# Patient Record
Sex: Male | Born: 1970 | Race: Black or African American | Hispanic: No | Marital: Married | Smoking: Never smoker
Health system: Southern US, Community
[De-identification: ages and names within clinical notes are randomized; demographics above are authoritative.]

## PROBLEM LIST (undated history)

## (undated) DIAGNOSIS — I10 Essential (primary) hypertension: Secondary | ICD-10-CM

## (undated) DIAGNOSIS — U071 COVID-19: Secondary | ICD-10-CM

## (undated) DIAGNOSIS — E119 Type 2 diabetes mellitus without complications: Secondary | ICD-10-CM

---

## 2004-10-28 ENCOUNTER — Emergency Department (HOSPITAL_COMMUNITY): Admission: EM | Admit: 2004-10-28 | Discharge: 2004-10-28 | Payer: Self-pay | Admitting: Family Medicine

## 2006-11-17 ENCOUNTER — Emergency Department (HOSPITAL_COMMUNITY): Admission: EM | Admit: 2006-11-17 | Discharge: 2006-11-18 | Payer: Self-pay | Admitting: Emergency Medicine

## 2007-01-19 ENCOUNTER — Emergency Department (HOSPITAL_COMMUNITY): Admission: EM | Admit: 2007-01-19 | Discharge: 2007-01-19 | Payer: Self-pay | Admitting: Emergency Medicine

## 2007-12-18 ENCOUNTER — Emergency Department (HOSPITAL_COMMUNITY): Admission: EM | Admit: 2007-12-18 | Discharge: 2007-12-18 | Payer: Self-pay | Admitting: Emergency Medicine

## 2009-03-20 ENCOUNTER — Emergency Department (HOSPITAL_COMMUNITY): Admission: EM | Admit: 2009-03-20 | Discharge: 2009-03-20 | Payer: Self-pay | Admitting: Family Medicine

## 2010-09-22 ENCOUNTER — Emergency Department (HOSPITAL_COMMUNITY)
Admission: EM | Admit: 2010-09-22 | Discharge: 2010-09-22 | Payer: Self-pay | Source: Home / Self Care | Admitting: Emergency Medicine

## 2013-06-02 ENCOUNTER — Other Ambulatory Visit: Payer: Self-pay | Admitting: Orthopaedic Surgery

## 2013-06-02 DIAGNOSIS — M25532 Pain in left wrist: Secondary | ICD-10-CM

## 2013-06-03 ENCOUNTER — Other Ambulatory Visit: Payer: Self-pay

## 2013-06-04 ENCOUNTER — Other Ambulatory Visit: Payer: Self-pay

## 2018-02-24 ENCOUNTER — Ambulatory Visit (HOSPITAL_COMMUNITY)
Admission: EM | Admit: 2018-02-24 | Discharge: 2018-02-24 | Disposition: A | Discharge status: Home / Self Care | Payer: Non-veteran care | Attending: Internal Medicine | Admitting: Internal Medicine

## 2018-02-24 ENCOUNTER — Ambulatory Visit (INDEPENDENT_AMBULATORY_CARE_PROVIDER_SITE_OTHER): Payer: Non-veteran care

## 2018-02-24 ENCOUNTER — Encounter (HOSPITAL_COMMUNITY): Payer: Self-pay | Admitting: Emergency Medicine

## 2018-02-24 ENCOUNTER — Other Ambulatory Visit: Payer: Self-pay

## 2018-02-24 DIAGNOSIS — E119 Type 2 diabetes mellitus without complications: Secondary | ICD-10-CM | POA: Diagnosis not present

## 2018-02-24 DIAGNOSIS — R5383 Other fatigue: Secondary | ICD-10-CM

## 2018-02-24 DIAGNOSIS — R7989 Other specified abnormal findings of blood chemistry: Secondary | ICD-10-CM

## 2018-02-24 DIAGNOSIS — M791 Myalgia, unspecified site: Secondary | ICD-10-CM

## 2018-02-24 DIAGNOSIS — Z79899 Other long term (current) drug therapy: Secondary | ICD-10-CM | POA: Insufficient documentation

## 2018-02-24 DIAGNOSIS — R5381 Other malaise: Secondary | ICD-10-CM

## 2018-02-24 DIAGNOSIS — R638 Other symptoms and signs concerning food and fluid intake: Secondary | ICD-10-CM | POA: Diagnosis not present

## 2018-02-24 DIAGNOSIS — Z7984 Long term (current) use of oral hypoglycemic drugs: Secondary | ICD-10-CM | POA: Insufficient documentation

## 2018-02-24 DIAGNOSIS — E1165 Type 2 diabetes mellitus with hyperglycemia: Secondary | ICD-10-CM

## 2018-02-24 DIAGNOSIS — R42 Dizziness and giddiness: Secondary | ICD-10-CM | POA: Diagnosis not present

## 2018-02-24 DIAGNOSIS — I10 Essential (primary) hypertension: Secondary | ICD-10-CM | POA: Insufficient documentation

## 2018-02-24 HISTORY — DX: Type 2 diabetes mellitus without complications: E11.9

## 2018-02-24 HISTORY — DX: Essential (primary) hypertension: I10

## 2018-02-24 LAB — CBC WITH DIFFERENTIAL/PLATELET
BAND NEUTROPHILS: 6 %
BASOS ABS: 0 10*3/uL (ref 0.0–0.1)
BLASTS: 0 %
Basophils Relative: 0 %
EOS ABS: 0.1 10*3/uL (ref 0.0–0.7)
EOS PCT: 1 %
HEMATOCRIT: 38.8 % — AB (ref 39.0–52.0)
Hemoglobin: 13.3 g/dL (ref 13.0–17.0)
LYMPHS ABS: 2.2 10*3/uL (ref 0.7–4.0)
Lymphocytes Relative: 39 %
MCH: 28.6 pg (ref 26.0–34.0)
MCHC: 34.3 g/dL (ref 30.0–36.0)
MCV: 83.4 fL (ref 78.0–100.0)
METAMYELOCYTES PCT: 0 %
MYELOCYTES: 0 %
Monocytes Absolute: 0.7 10*3/uL (ref 0.1–1.0)
Monocytes Relative: 12 %
Neutro Abs: 2.6 10*3/uL (ref 1.7–7.7)
Neutrophils Relative %: 42 %
Other: 0 %
PLATELETS: 200 10*3/uL (ref 150–400)
Promyelocytes Relative: 0 %
RBC: 4.65 MIL/uL (ref 4.22–5.81)
RDW: 11.5 % (ref 11.5–15.5)
Smear Review: ADEQUATE
WBC: 5.6 10*3/uL (ref 4.0–10.5)
nRBC: 0 /100 WBC

## 2018-02-24 LAB — COMPREHENSIVE METABOLIC PANEL
ALBUMIN: 3.4 g/dL — AB (ref 3.5–5.0)
ALT: 37 U/L (ref 17–63)
AST: 41 U/L (ref 15–41)
Alkaline Phosphatase: 58 U/L (ref 38–126)
Anion gap: 11 (ref 5–15)
BILIRUBIN TOTAL: 1.3 mg/dL — AB (ref 0.3–1.2)
BUN: 14 mg/dL (ref 6–20)
CHLORIDE: 99 mmol/L — AB (ref 101–111)
CO2: 27 mmol/L (ref 22–32)
Calcium: 8.5 mg/dL — ABNORMAL LOW (ref 8.9–10.3)
Creatinine, Ser: 1.64 mg/dL — ABNORMAL HIGH (ref 0.61–1.24)
GFR calc Af Amer: 57 mL/min — ABNORMAL LOW (ref >60)
GFR calc non Af Amer: 49 mL/min — ABNORMAL LOW (ref >60)
GLUCOSE: 398 mg/dL — AB (ref 65–99)
POTASSIUM: 4.1 mmol/L (ref 3.5–5.1)
SODIUM: 137 mmol/L (ref 135–145)
Total Protein: 6.7 g/dL (ref 6.5–8.1)

## 2018-02-24 LAB — POCT URINALYSIS DIP (DEVICE)
Bilirubin Urine: NEGATIVE
Glucose, UA: >=1000 mg/dL — AB
Ketones, ur: NEGATIVE mg/dL
Leukocytes, UA: NEGATIVE
Nitrite: NEGATIVE
PROTEIN: 100 mg/dL — AB
SPECIFIC GRAVITY, URINE: 1.025 (ref 1.005–1.030)
UROBILINOGEN UA: 0.2 mg/dL (ref 0.0–1.0)
pH: 5.5 (ref 5.0–8.0)

## 2018-02-24 LAB — POCT I-STAT, CHEM 8
BUN: 16 mg/dL (ref 6–20)
CALCIUM ION: 1.11 mmol/L — AB (ref 1.15–1.40)
CHLORIDE: 96 mmol/L — AB (ref 101–111)
CREATININE: 1.5 mg/dL — AB (ref 0.61–1.24)
GLUCOSE: 391 mg/dL — AB (ref 65–99)
HCT: 40 % (ref 39.0–52.0)
Hemoglobin: 13.6 g/dL (ref 13.0–17.0)
POTASSIUM: 3.7 mmol/L (ref 3.5–5.1)
Sodium: 136 mmol/L (ref 135–145)
TCO2: 25 mmol/L (ref 22–32)

## 2018-02-24 MED ORDER — SODIUM CHLORIDE 0.9 % IV BOLUS
1000.0000 mL | Freq: Once | INTRAVENOUS | Status: AC
  Administered 2018-02-24: 1000 mL via INTRAVENOUS

## 2018-02-24 NOTE — ED Notes (Signed)
Fluid infusion in progress.

## 2018-02-24 NOTE — ED Triage Notes (Signed)
The patient [facility] with a complaint of generalized pain, fatigue and loss of appetite x 3 days.

## 2018-02-24 NOTE — ED Provider Notes (Signed)
[facility]    CSN: [id number] Arrival date & time: 02/24/18  1001     History   Chief Complaint Chief Complaint  Patient presents with  . Muscle Pain    HPI Jabril Pursell is a 47 y.o. male.   46 year old male with history of diabetes, hypertension comes in for 5-day history of fatigue, loss of appetite, generalized muscle aches.  He states all of this started after he finished taking a computer test, with acute onset. States afterwards he also had nausea that has since resolved.  Left lower quadrant abdominal pain that resolved.  He denies any vomiting.  States that stool was softer than normal, but not watery.  No melena, hematochezia.  He has had subjective fever, chills, night sweats.  States mild rhinorrhea, occasional cough.  Denies chest pain.  States when he stands up, the pain in the back can cause him to feel short of breath, which also caused him to feel palpitations.  Denies syncope.  Does feel occasional lightheadedness and dizziness.  He describes the dizziness as room spinning.  Denies urinary frequency, dysuria, hematuria, does not endorse darker urine.  He has had normal activity without increase in activity, denies any injury/trauma.  He states has joint pain at baseline, but not worse than normal, no swelling of the joints.  Denies possibility of tick bites.  States he has lost 15 pounds in the past 5 days, states possibly due to not eating. His normal weight is around 230lb. States last A1c was above target, but does not recall the number.  Former smoker.  Denies alcohol use, illicit drug use.  States he has been taking his glipizide daily as directed.  He has not taking his blood pressure medicine for the past 3 days, but did take this morning.      Past Medical History:  Diagnosis Date  . Diabetes mellitus without complication (HCC)   . Hypertension     There are no active problems to display for this patient.   History reviewed. No pertinent surgical  history.     Home Medications    Prior to Admission medications   Medication Sig Start Date End Date Taking? Authorizing Provider  amLODipine (NORVASC) 5 MG tablet Take 5 mg by mouth daily.   Yes [provider]  glipiZIDE (GLUCOTROL) 10 MG tablet Take 10 mg by mouth daily before breakfast.   Yes [provider]  lisinopril-hydrochlorothiazide (PRINZIDE,ZESTORETIC) 10-12.5 MG tablet Take 1 tablet by mouth daily.   Yes [provider]    Family History Family History  Problem Relation Age of Onset  . Cancer Maternal Grandfather   . Diabetes Maternal Grandfather   . Hypertension Maternal Grandfather   . Cancer Maternal Grandmother   . Diabetes Maternal Grandmother   . Hypertension Maternal Grandmother     Social History Social History   Tobacco Use  . Smoking status: Never Smoker  . Smokeless tobacco: Never Used  Substance Use Topics  . Alcohol use: Not Currently  . Drug use: Never     Allergies   Patient has no known allergies.   Review of Systems Review of Systems  Reason unable to perform ROS: See HPI as above.     Physical Exam Triage Vital Signs ED Triage Vitals  Enc Vitals Group     BP 02/24/18 1009 (!) 141/85     Pulse Rate 02/24/18 1009 92     Resp 02/24/18 1009 16     Temp 02/24/18 1009  98.6 F (37 C)     Temp Source 02/24/18 1009 Oral     SpO2 02/24/18 1009 99 %     Weight --      Height --      Head Circumference --      Peak Flow --      Pain Score 02/24/18 1012 7     Pain Loc --      Pain Edu? --      Excl. in GC? --    No data found.  Updated Vital Signs BP (!) 141/85 (BP Location: Left Arm)   Pulse 92   Temp 98.6 F (37 C) (Oral)   Resp 16   Wt 217 lb (98.4 kg)   SpO2 99%   Physical Exam  Constitutional: He is oriented to person, place, and time. He appears well-developed and well-nourished. No distress.  HENT:  Head: Normocephalic and atraumatic.  Right Ear: Tympanic membrane, external ear and  ear canal normal. Tympanic membrane is not erythematous and not bulging.  Left Ear: Tympanic membrane, external ear and ear canal normal. Tympanic membrane is not erythematous and not bulging.  Nose: Nose normal. Right sinus exhibits no maxillary sinus tenderness and no frontal sinus tenderness. Left sinus exhibits no maxillary sinus tenderness and no frontal sinus tenderness.  Mouth/Throat: Uvula is midline, oropharynx is clear and moist and mucous membranes are normal.  Eyes: Pupils are equal, round, and reactive to light. Conjunctivae and EOM are normal. No scleral icterus.  Neck: Normal range of motion. Neck supple.  Cardiovascular: Normal rate, regular rhythm and normal heart sounds. Exam reveals no gallop and no friction rub.  No murmur heard. Pulmonary/Chest: Effort normal and breath sounds normal. No accessory muscle usage or stridor. No respiratory distress. He has no decreased breath sounds. He has no wheezes. He has no rhonchi. He has no rales.  Abdominal: Soft. Bowel sounds are normal. There is no tenderness. There is no rebound and no guarding.  Musculoskeletal:  Diffuse tenderness to palpation of neck, back, arms.  Full range of motion of neck, shoulder, elbow, hands, back.  Strength normal and equal bilaterally. Sensation intact and equal bilaterally. Radial pulse 2+ and equal bilaterally. Cap refill <2s.  Lymphadenopathy:    He has no cervical adenopathy.  Neurological: He is alert and oriented to person, place, and time. He has normal strength. He is not disoriented. No cranial nerve deficit or sensory deficit. He displays a negative Romberg sign. Coordination and gait normal. GCS eye subscore is 4. GCS verbal subscore is 5. GCS motor subscore is 6.  Normal finger-to-nose, rapid movement.  Able to get on and off exam table without difficulty or hesitancy.  Skin: Skin is warm and dry. He is not diaphoretic.  Psychiatric: He has a normal mood and affect. His behavior is normal.  Judgment normal.     UC Treatments / Results  Labs (all labs ordered are listed, but only abnormal results are displayed) Labs Reviewed  CBC WITH DIFFERENTIAL/PLATELET - Abnormal; Notable for the following components:      Result Value   HCT 38.8 (*)    All other components within normal limits  COMPREHENSIVE METABOLIC PANEL - Abnormal; Notable for the following components:   Chloride 99 (*)    Glucose, Bld 398 (*)    Creatinine, Ser 1.64 (*)    Calcium 8.5 (*)    Albumin 3.4 (*)    Total Bilirubin 1.3 (*)    GFR calc non Af Denyse Dago  49 (*)    GFR calc Af Amer 57 (*)    All other components within normal limits  POCT I-STAT, CHEM 8 - Abnormal; Notable for the following components:   Chloride 96 (*)    Creatinine, Ser 1.50 (*)    Glucose, Bld 391 (*)    Calcium, Ion 1.11 (*)    All other components within normal limits  POCT URINALYSIS DIP (DEVICE) - Abnormal; Notable for the following components:   Glucose, UA >=1000 (*)    Hgb urine dipstick TRACE (*)    Protein, ur 100 (*)    All other components within normal limits    EKG None  Radiology Dg Chest 2 View  Result Date: 02/24/2018 CLINICAL DATA:  Three day history of upper back pain with chills and night sweats and fever. History of diabetes, nonsmoker. EXAM: CHEST - 2 VIEW COMPARISON:  PA and lateral chest x-ray of November 17, 2006 FINDINGS: The lungs are adequately inflated. There is no focal infiltrate. There is no pleural effusion. The heart and pulmonary vascularity are normal. The mediastinum is normal in width. The bony thorax is unremarkable. IMPRESSION: There is no active cardiopulmonary disease. Electronically Signed   By: David  SwazilandJordan M.D.   On: 02/24/2018 11:09    Procedures Procedures (including critical care time)  Medications Ordered in UC Medications  sodium chloride 0.9 % bolus 1,000 mL (0 mLs Intravenous Stopped 02/24/18 1242)    Initial Impression / Assessment and Plan / UC Course  I have reviewed  the triage vital signs and the nursing notes.  Pertinent labs & imaging results that were available during my care of the patient were reviewed by me and considered in my medical decision making (see chart for details).    Discussed case with Dr Dayton ScrapeMurray. 47 year old male with history of DM, HTN comes in for acute onset of generalized muscle ache, fatigue, malaise, loss of appetite for the past 5 days. Patient is afebile without tachypnea, tachycardia. He is nontoxic in appearance without acute distress. Physical exam unremarkable. Blood work with creatinine of 1.5, blood glucose of 391 without prior values for comparison. CXR without active cardiopulmonary disease. Will provide 1L fluid and for patient to follow up with PCP in 5-7days for reevaluation. Strict return precautions given. Patient expresses understanding and agrees to plan.  Case discussed with Dr Dayton ScrapeMurray, who agrees to plan.   Final Clinical Impressions(s) / UC Diagnoses   Final diagnoses:  Generalized muscle ache  Malaise and fatigue  Elevated serum creatinine    ED Prescriptions    None        Belinda FisherYu, Jannice Beitzel V, PA-C 02/24/18 1514

## 2018-02-24 NOTE — Discharge Instructions (Signed)
As discussed, your lab work showed a decrease in kidney function.  Glucose elevated at 391.  Your given the back of fluids here.  Keep hydrated at home, urine should be clear to pale yellow in color.  If symptoms worsens, go to the emergency department for further evaluation.  We would like you to be seen in 5 to 7 days by your primary care doctor.  If unable to make appointment with primary care, you can follow-up here in 5 to 7 days for reevaluation.

## 2018-02-25 LAB — PATHOLOGIST SMEAR REVIEW: Path Review: REACTIVE

## 2018-07-26 IMAGING — DX DG CHEST 2V
2 series · 2 of 2 positions shown · non-contrast
Comparison: PA and lateral chest x-ray November 17, 2006

CLINICAL DATA: Three day history of upper back pain with chills and
night sweats and fever. History of diabetes, nonsmoker.

EXAM:
CHEST - 2 VIEW

[chest pa]
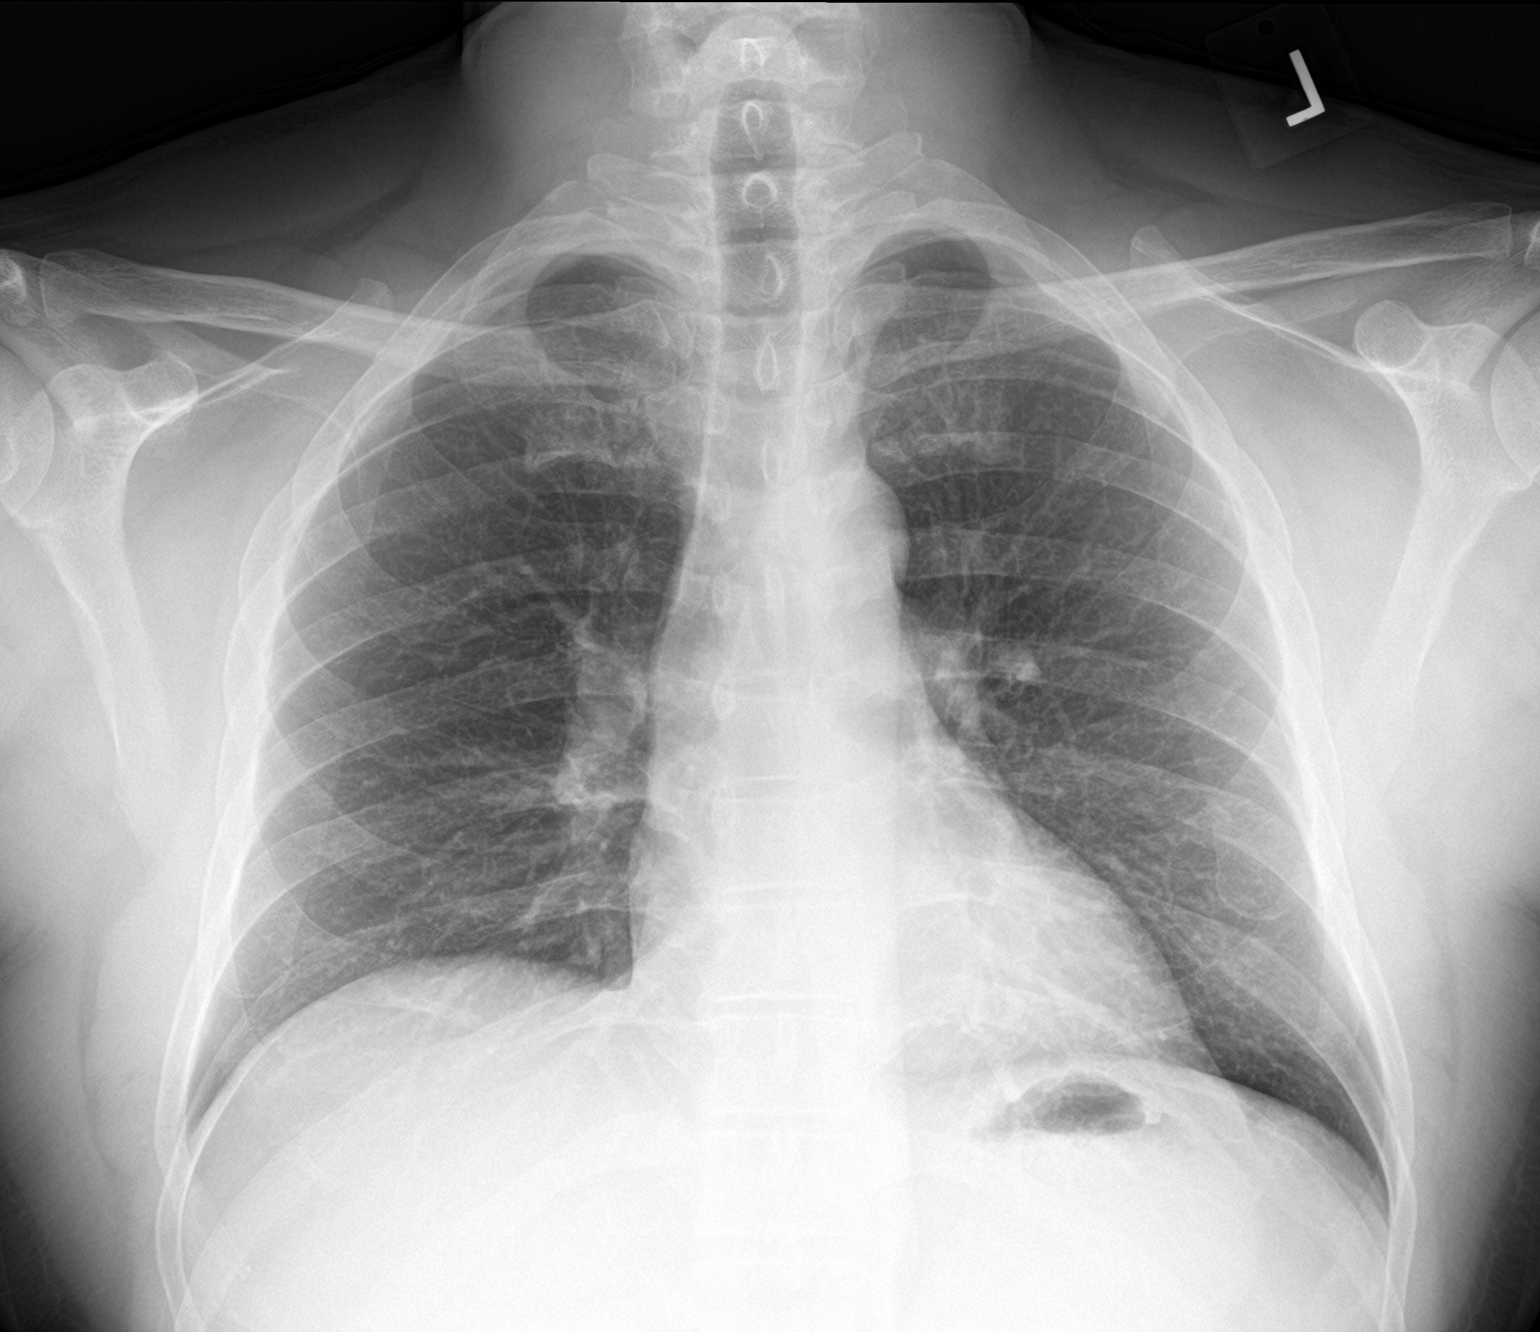

[chest lat]
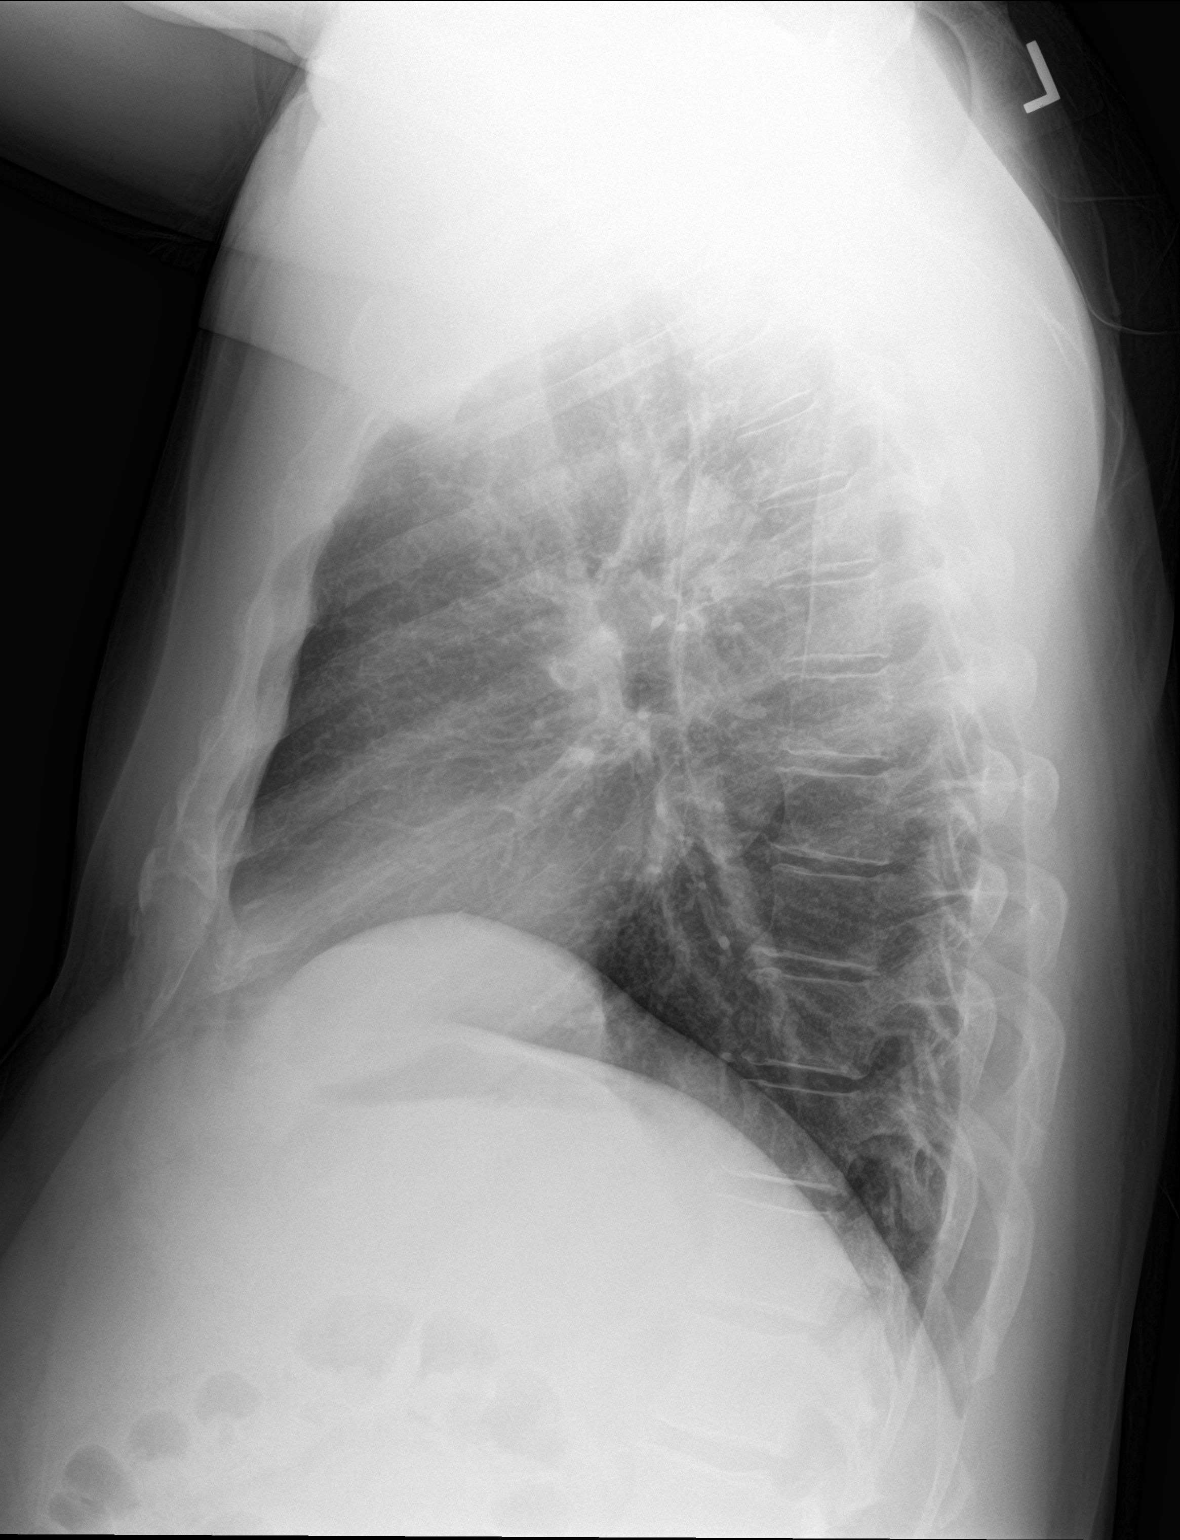

[2 of 2 positions shown; findings below may reference images not displayed]

FINDINGS: The lungs are adequately inflated. There is no focal infiltrate.
There is no pleural effusion. The heart and pulmonary vascularity
are normal. The mediastinum is normal in width. The bony thorax is
unremarkable.
IMPRESSION: There is no active cardiopulmonary disease.

## 2019-10-19 ENCOUNTER — Encounter (HOSPITAL_COMMUNITY): Payer: Self-pay | Admitting: Emergency Medicine

## 2019-10-19 ENCOUNTER — Other Ambulatory Visit: Payer: Self-pay

## 2019-10-19 ENCOUNTER — Inpatient Hospital Stay (HOSPITAL_COMMUNITY)
Admission: EM | Admit: 2019-10-19 | Discharge: 2019-10-22 | DRG: 177 | Disposition: A | Discharge status: Home / Self Care | Payer: No Typology Code available for payment source | Source: Ambulatory Visit | Attending: Internal Medicine | Admitting: Internal Medicine

## 2019-10-19 DIAGNOSIS — Z794 Long term (current) use of insulin: Secondary | ICD-10-CM

## 2019-10-19 DIAGNOSIS — I10 Essential (primary) hypertension: Secondary | ICD-10-CM | POA: Diagnosis present

## 2019-10-19 DIAGNOSIS — E119 Type 2 diabetes mellitus without complications: Secondary | ICD-10-CM

## 2019-10-19 DIAGNOSIS — A0839 Other viral enteritis: Secondary | ICD-10-CM | POA: Diagnosis present

## 2019-10-19 DIAGNOSIS — I129 Hypertensive chronic kidney disease with stage 1 through stage 4 chronic kidney disease, or unspecified chronic kidney disease: Secondary | ICD-10-CM | POA: Diagnosis present

## 2019-10-19 DIAGNOSIS — R7989 Other specified abnormal findings of blood chemistry: Secondary | ICD-10-CM | POA: Diagnosis present

## 2019-10-19 DIAGNOSIS — U071 COVID-19: Secondary | ICD-10-CM | POA: Diagnosis not present

## 2019-10-19 DIAGNOSIS — Z833 Family history of diabetes mellitus: Secondary | ICD-10-CM

## 2019-10-19 DIAGNOSIS — E1100 Type 2 diabetes mellitus with hyperosmolarity without nonketotic hyperglycemic-hyperosmolar coma (NKHHC): Secondary | ICD-10-CM

## 2019-10-19 DIAGNOSIS — N179 Acute kidney failure, unspecified: Secondary | ICD-10-CM | POA: Diagnosis not present

## 2019-10-19 DIAGNOSIS — M549 Dorsalgia, unspecified: Secondary | ICD-10-CM | POA: Diagnosis present

## 2019-10-19 DIAGNOSIS — E1165 Type 2 diabetes mellitus with hyperglycemia: Secondary | ICD-10-CM | POA: Diagnosis present

## 2019-10-19 DIAGNOSIS — J1282 Pneumonia due to coronavirus disease 2019: Secondary | ICD-10-CM | POA: Diagnosis present

## 2019-10-19 DIAGNOSIS — R197 Diarrhea, unspecified: Secondary | ICD-10-CM | POA: Diagnosis present

## 2019-10-19 DIAGNOSIS — Z8249 Family history of ischemic heart disease and other diseases of the circulatory system: Secondary | ICD-10-CM

## 2019-10-19 DIAGNOSIS — D72829 Elevated white blood cell count, unspecified: Secondary | ICD-10-CM | POA: Diagnosis present

## 2019-10-19 DIAGNOSIS — E1122 Type 2 diabetes mellitus with diabetic chronic kidney disease: Secondary | ICD-10-CM | POA: Diagnosis present

## 2019-10-19 DIAGNOSIS — N183 Chronic kidney disease, stage 3 unspecified: Secondary | ICD-10-CM | POA: Diagnosis present

## 2019-10-19 HISTORY — DX: COVID-19: U07.1

## 2019-10-19 LAB — CBC WITH DIFFERENTIAL/PLATELET
Abs Immature Granulocytes: 0.07 10*3/uL (ref 0.00–0.07)
Basophils Absolute: 0 10*3/uL (ref 0.0–0.1)
Basophils Relative: 0 %
Eosinophils Absolute: 0 10*3/uL (ref 0.0–0.5)
Eosinophils Relative: 0 %
HCT: 41.4 % (ref 39.0–52.0)
Hemoglobin: 14.7 g/dL (ref 13.0–17.0)
Immature Granulocytes: 1 %
Lymphocytes Relative: 12 %
Lymphs Abs: 1.5 10*3/uL (ref 0.7–4.0)
MCH: 28.5 pg (ref 26.0–34.0)
MCHC: 35.5 g/dL (ref 30.0–36.0)
MCV: 80.4 fL (ref 80.0–100.0)
Monocytes Absolute: 0.9 10*3/uL (ref 0.1–1.0)
Monocytes Relative: 7 %
Neutro Abs: 10.1 10*3/uL — ABNORMAL HIGH (ref 1.7–7.7)
Neutrophils Relative %: 80 %
Platelets: 402 10*3/uL — ABNORMAL HIGH (ref 150–400)
RBC: 5.15 MIL/uL (ref 4.22–5.81)
RDW: 11.2 % — ABNORMAL LOW (ref 11.5–15.5)
WBC: 12.5 10*3/uL — ABNORMAL HIGH (ref 4.0–10.5)
nRBC: 0 % (ref 0.0–0.2)

## 2019-10-19 LAB — COMPREHENSIVE METABOLIC PANEL
ALT: 18 U/L (ref 0–44)
AST: 20 U/L (ref 15–41)
Albumin: 2.2 g/dL — ABNORMAL LOW (ref 3.5–5.0)
Alkaline Phosphatase: 63 U/L (ref 38–126)
Anion gap: 14 (ref 5–15)
BUN: 41 mg/dL — ABNORMAL HIGH (ref 6–20)
CO2: 25 mmol/L (ref 22–32)
Calcium: 8 mg/dL — ABNORMAL LOW (ref 8.9–10.3)
Chloride: 91 mmol/L — ABNORMAL LOW (ref 98–111)
Creatinine, Ser: 2.88 mg/dL — ABNORMAL HIGH (ref 0.61–1.24)
GFR calc Af Amer: 29 mL/min — ABNORMAL LOW (ref >60)
GFR calc non Af Amer: 25 mL/min — ABNORMAL LOW (ref >60)
Glucose, Bld: 435 mg/dL — ABNORMAL HIGH (ref 70–99)
Potassium: 3.5 mmol/L (ref 3.5–5.1)
Sodium: 130 mmol/L — ABNORMAL LOW (ref 135–145)
Total Bilirubin: 1.2 mg/dL (ref 0.3–1.2)
Total Protein: 6.6 g/dL (ref 6.5–8.1)

## 2019-10-19 NOTE — ED Triage Notes (Signed)
Pt to ED with c/o dehydration   Pt st's he was dx with Covid on 1/30 and went to the [place] today for check up.  Pt st's after he got home he was called and told to come to ED for IV fluids.

## 2019-10-20 ENCOUNTER — Observation Stay (HOSPITAL_COMMUNITY): Payer: No Typology Code available for payment source

## 2019-10-20 ENCOUNTER — Encounter (HOSPITAL_COMMUNITY): Payer: Self-pay | Admitting: Internal Medicine

## 2019-10-20 ENCOUNTER — Emergency Department (HOSPITAL_COMMUNITY): Payer: No Typology Code available for payment source

## 2019-10-20 DIAGNOSIS — R197 Diarrhea, unspecified: Secondary | ICD-10-CM | POA: Diagnosis present

## 2019-10-20 DIAGNOSIS — E119 Type 2 diabetes mellitus without complications: Secondary | ICD-10-CM | POA: Diagnosis not present

## 2019-10-20 DIAGNOSIS — J1282 Pneumonia due to coronavirus disease 2019: Secondary | ICD-10-CM | POA: Diagnosis present

## 2019-10-20 DIAGNOSIS — E1100 Type 2 diabetes mellitus with hyperosmolarity without nonketotic hyperglycemic-hyperosmolar coma (NKHHC): Secondary | ICD-10-CM | POA: Diagnosis not present

## 2019-10-20 DIAGNOSIS — D72829 Elevated white blood cell count, unspecified: Secondary | ICD-10-CM | POA: Diagnosis present

## 2019-10-20 DIAGNOSIS — Z794 Long term (current) use of insulin: Secondary | ICD-10-CM | POA: Diagnosis not present

## 2019-10-20 DIAGNOSIS — M549 Dorsalgia, unspecified: Secondary | ICD-10-CM | POA: Diagnosis present

## 2019-10-20 DIAGNOSIS — Z833 Family history of diabetes mellitus: Secondary | ICD-10-CM | POA: Diagnosis not present

## 2019-10-20 DIAGNOSIS — N183 Chronic kidney disease, stage 3 unspecified: Secondary | ICD-10-CM | POA: Diagnosis present

## 2019-10-20 DIAGNOSIS — R7989 Other specified abnormal findings of blood chemistry: Secondary | ICD-10-CM | POA: Diagnosis present

## 2019-10-20 DIAGNOSIS — I10 Essential (primary) hypertension: Secondary | ICD-10-CM | POA: Diagnosis present

## 2019-10-20 DIAGNOSIS — A09 Infectious gastroenteritis and colitis, unspecified: Secondary | ICD-10-CM | POA: Diagnosis not present

## 2019-10-20 DIAGNOSIS — A0839 Other viral enteritis: Secondary | ICD-10-CM | POA: Diagnosis present

## 2019-10-20 DIAGNOSIS — Z8249 Family history of ischemic heart disease and other diseases of the circulatory system: Secondary | ICD-10-CM | POA: Diagnosis not present

## 2019-10-20 DIAGNOSIS — U071 COVID-19: Secondary | ICD-10-CM | POA: Diagnosis present

## 2019-10-20 DIAGNOSIS — N179 Acute kidney failure, unspecified: Secondary | ICD-10-CM | POA: Diagnosis present

## 2019-10-20 DIAGNOSIS — N182 Chronic kidney disease, stage 2 (mild): Secondary | ICD-10-CM

## 2019-10-20 DIAGNOSIS — E1165 Type 2 diabetes mellitus with hyperglycemia: Secondary | ICD-10-CM | POA: Diagnosis present

## 2019-10-20 DIAGNOSIS — I129 Hypertensive chronic kidney disease with stage 1 through stage 4 chronic kidney disease, or unspecified chronic kidney disease: Secondary | ICD-10-CM | POA: Diagnosis present

## 2019-10-20 DIAGNOSIS — E1122 Type 2 diabetes mellitus with diabetic chronic kidney disease: Secondary | ICD-10-CM | POA: Diagnosis present

## 2019-10-20 LAB — BASIC METABOLIC PANEL
Anion gap: 14 (ref 5–15)
Anion gap: 13 (ref 5–15)
Anion gap: 11 (ref 5–15)
Anion gap: 9 (ref 5–15)
Anion gap: 9 (ref 5–15)
BUN: 45 mg/dL — ABNORMAL HIGH (ref 6–20)
BUN: 47 mg/dL — ABNORMAL HIGH (ref 6–20)
BUN: 43 mg/dL — ABNORMAL HIGH (ref 6–20)
BUN: 44 mg/dL — ABNORMAL HIGH (ref 6–20)
BUN: 48 mg/dL — ABNORMAL HIGH (ref 6–20)
CO2: 26 mmol/L (ref 22–32)
CO2: 25 mmol/L (ref 22–32)
CO2: 27 mmol/L (ref 22–32)
CO2: 26 mmol/L (ref 22–32)
CO2: 26 mmol/L (ref 22–32)
Calcium: 7.5 mg/dL — ABNORMAL LOW (ref 8.9–10.3)
Calcium: 7.7 mg/dL — ABNORMAL LOW (ref 8.9–10.3)
Calcium: 7.6 mg/dL — ABNORMAL LOW (ref 8.9–10.3)
Calcium: 7.7 mg/dL — ABNORMAL LOW (ref 8.9–10.3)
Calcium: 7.7 mg/dL — ABNORMAL LOW (ref 8.9–10.3)
Chloride: 94 mmol/L — ABNORMAL LOW (ref 98–111)
Chloride: 96 mmol/L — ABNORMAL LOW (ref 98–111)
Chloride: 102 mmol/L (ref 98–111)
Chloride: 101 mmol/L (ref 98–111)
Chloride: 102 mmol/L (ref 98–111)
Creatinine, Ser: 2.82 mg/dL — ABNORMAL HIGH (ref 0.61–1.24)
Creatinine, Ser: 2.65 mg/dL — ABNORMAL HIGH (ref 0.61–1.24)
Creatinine, Ser: 2.25 mg/dL — ABNORMAL HIGH (ref 0.61–1.24)
Creatinine, Ser: 2.11 mg/dL — ABNORMAL HIGH (ref 0.61–1.24)
Creatinine, Ser: 2.19 mg/dL — ABNORMAL HIGH (ref 0.61–1.24)
GFR calc Af Amer: 30 mL/min — ABNORMAL LOW (ref >60)
GFR calc Af Amer: 32 mL/min — ABNORMAL LOW (ref >60)
GFR calc Af Amer: 39 mL/min — ABNORMAL LOW (ref >60)
GFR calc Af Amer: 42 mL/min — ABNORMAL LOW (ref >60)
GFR calc Af Amer: 40 mL/min — ABNORMAL LOW (ref >60)
GFR calc non Af Amer: 26 mL/min — ABNORMAL LOW (ref >60)
GFR calc non Af Amer: 28 mL/min — ABNORMAL LOW (ref >60)
GFR calc non Af Amer: 34 mL/min — ABNORMAL LOW (ref >60)
GFR calc non Af Amer: 36 mL/min — ABNORMAL LOW (ref >60)
GFR calc non Af Amer: 35 mL/min — ABNORMAL LOW (ref >60)
Glucose, Bld: 419 mg/dL — ABNORMAL HIGH (ref 70–99)
Glucose, Bld: 410 mg/dL — ABNORMAL HIGH (ref 70–99)
Glucose, Bld: 191 mg/dL — ABNORMAL HIGH (ref 70–99)
Glucose, Bld: 290 mg/dL — ABNORMAL HIGH (ref 70–99)
Glucose, Bld: 338 mg/dL — ABNORMAL HIGH (ref 70–99)
Potassium: 3.6 mmol/L (ref 3.5–5.1)
Potassium: 3.7 mmol/L (ref 3.5–5.1)
Potassium: 3.4 mmol/L — ABNORMAL LOW (ref 3.5–5.1)
Potassium: 3.8 mmol/L (ref 3.5–5.1)
Potassium: 3.6 mmol/L (ref 3.5–5.1)
Sodium: 134 mmol/L — ABNORMAL LOW (ref 135–145)
Sodium: 134 mmol/L — ABNORMAL LOW (ref 135–145)
Sodium: 140 mmol/L (ref 135–145)
Sodium: 136 mmol/L (ref 135–145)
Sodium: 137 mmol/L (ref 135–145)

## 2019-10-20 LAB — FERRITIN: Ferritin: 424 ng/mL — ABNORMAL HIGH (ref 24–336)

## 2019-10-20 LAB — URINALYSIS, COMPLETE (UACMP) WITH MICROSCOPIC
Bilirubin Urine: NEGATIVE
Glucose, UA: >=500 mg/dL — AB
Ketones, ur: 5 mg/dL — AB
Leukocytes,Ua: NEGATIVE
Nitrite: NEGATIVE
Protein, ur: 100 mg/dL — AB
Specific Gravity, Urine: 1.014 (ref 1.005–1.030)
pH: 5 (ref 5.0–8.0)

## 2019-10-20 LAB — GLUCOSE, CAPILLARY
Glucose-Capillary: 418 mg/dL — ABNORMAL HIGH (ref 70–99)
Glucose-Capillary: 359 mg/dL — ABNORMAL HIGH (ref 70–99)
Glucose-Capillary: 289 mg/dL — ABNORMAL HIGH (ref 70–99)
Glucose-Capillary: 200 mg/dL — ABNORMAL HIGH (ref 70–99)
Glucose-Capillary: 268 mg/dL — ABNORMAL HIGH (ref 70–99)
Glucose-Capillary: 192 mg/dL — ABNORMAL HIGH (ref 70–99)
Glucose-Capillary: 214 mg/dL — ABNORMAL HIGH (ref 70–99)
Glucose-Capillary: 276 mg/dL — ABNORMAL HIGH (ref 70–99)
Glucose-Capillary: 352 mg/dL — ABNORMAL HIGH (ref 70–99)

## 2019-10-20 LAB — HIV ANTIBODY (ROUTINE TESTING W REFLEX): HIV Screen 4th Generation wRfx: NONREACTIVE

## 2019-10-20 LAB — SAMPLE TO BLOOD BANK

## 2019-10-20 LAB — ABO/RH: ABO/RH(D): O POS

## 2019-10-20 LAB — D-DIMER, QUANTITATIVE: D-Dimer, Quant: 0.43 ug/mL-FEU (ref 0.00–0.50)

## 2019-10-20 LAB — PROCALCITONIN: Procalcitonin: <0.1 ng/mL

## 2019-10-20 LAB — LACTATE DEHYDROGENASE: LDH: 273 U/L — ABNORMAL HIGH (ref 98–192)

## 2019-10-20 LAB — SODIUM, URINE, RANDOM: Sodium, Ur: 43 mmol/L

## 2019-10-20 LAB — CREATININE, URINE, RANDOM: Creatinine, Urine: 113.95 mg/dL

## 2019-10-20 MED ORDER — SODIUM CHLORIDE 0.9 % IV BOLUS
1000.0000 mL | Freq: Once | INTRAVENOUS | Status: AC
Start: 2019-10-20 — End: 2019-10-20
  Administered 2019-10-20: 1000 mL via INTRAVENOUS

## 2019-10-20 MED ORDER — INSULIN ASPART 100 UNIT/ML ~~LOC~~ SOLN
0.0000 [IU] | Freq: Every day | SUBCUTANEOUS | Status: DC
  Administered 2019-10-20: 5 [IU] via SUBCUTANEOUS
  Administered 2019-10-21: 2 [IU] via SUBCUTANEOUS

## 2019-10-20 MED ORDER — POTASSIUM CHLORIDE 10 MEQ/100ML IV SOLN
10.0000 meq | INTRAVENOUS | Status: DC
  Administered 2019-10-20: 10 meq via INTRAVENOUS
  Filled 2019-10-20: qty 100

## 2019-10-20 MED ORDER — POTASSIUM CHLORIDE CRYS ER 20 MEQ PO TBCR
40.0000 meq | EXTENDED_RELEASE_TABLET | Freq: Once | ORAL | Status: AC
  Administered 2019-10-20: 12:00:00 40 meq via ORAL
  Filled 2019-10-20: qty 2

## 2019-10-20 MED ORDER — INSULIN GLARGINE 100 UNIT/ML ~~LOC~~ SOLN
7.0000 [IU] | Freq: Every day | SUBCUTANEOUS | Status: DC
  Administered 2019-10-20: 7 [IU] via SUBCUTANEOUS
  Filled 2019-10-20: qty 0.07

## 2019-10-20 MED ORDER — SODIUM CHLORIDE 0.9 % IV SOLN: INTRAVENOUS | Status: DC

## 2019-10-20 MED ORDER — GUAIFENESIN-DM 100-10 MG/5ML PO SYRP: 10.0000 mL | ORAL_SOLUTION | ORAL | Status: DC | PRN

## 2019-10-20 MED ORDER — ENOXAPARIN SODIUM 40 MG/0.4ML ~~LOC~~ SOLN
40.0000 mg | Freq: Every day | SUBCUTANEOUS | Status: DC
  Administered 2019-10-20 – 2019-10-22 (×3): 40 mg via SUBCUTANEOUS
  Filled 2019-10-20 (×3): qty 0.4

## 2019-10-20 MED ORDER — INSULIN ASPART 100 UNIT/ML ~~LOC~~ SOLN
4.0000 [IU] | Freq: Three times a day (TID) | SUBCUTANEOUS | Status: DC
  Administered 2019-10-20 – 2019-10-21 (×2): 4 [IU] via SUBCUTANEOUS

## 2019-10-20 MED ORDER — ATORVASTATIN CALCIUM 10 MG PO TABS
10.0000 mg | ORAL_TABLET | Freq: Every day | ORAL | Status: DC
  Administered 2019-10-20 – 2019-10-21 (×2): 10 mg via ORAL
  Filled 2019-10-20 (×2): qty 1

## 2019-10-20 MED ORDER — ONDANSETRON HCL 4 MG PO TABS: 4.0000 mg | ORAL_TABLET | Freq: Four times a day (QID) | ORAL | Status: DC | PRN

## 2019-10-20 MED ORDER — SODIUM CHLORIDE 0.9 % IV BOLUS
1000.0000 mL | Freq: Once | INTRAVENOUS | Status: AC
  Administered 2019-10-20: 1000 mL via INTRAVENOUS

## 2019-10-20 MED ORDER — HYDROCOD POLST-CPM POLST ER 10-8 MG/5ML PO SUER: 5.0000 mL | Freq: Two times a day (BID) | ORAL | Status: DC | PRN

## 2019-10-20 MED ORDER — ACETAMINOPHEN 650 MG RE SUPP: 650.0000 mg | Freq: Four times a day (QID) | RECTAL | Status: DC | PRN

## 2019-10-20 MED ORDER — INSULIN ASPART 100 UNIT/ML ~~LOC~~ SOLN
0.0000 [IU] | Freq: Three times a day (TID) | SUBCUTANEOUS | Status: DC
  Administered 2019-10-20: 8 [IU] via SUBCUTANEOUS
  Administered 2019-10-21: 5 [IU] via SUBCUTANEOUS
  Administered 2019-10-21: 2 [IU] via SUBCUTANEOUS
  Administered 2019-10-21: 3 [IU] via SUBCUTANEOUS

## 2019-10-20 MED ORDER — INSULIN ASPART 100 UNIT/ML ~~LOC~~ SOLN: 0.0000 [IU] | Freq: Three times a day (TID) | SUBCUTANEOUS | Status: DC

## 2019-10-20 MED ORDER — SODIUM CHLORIDE 0.9 % IV SOLN
100.0000 mg | Freq: Every day | INTRAVENOUS | Status: DC
  Administered 2019-10-21 – 2019-10-22 (×2): 100 mg via INTRAVENOUS
  Filled 2019-10-20 (×2): qty 20

## 2019-10-20 MED ORDER — ONDANSETRON HCL 4 MG/2ML IJ SOLN: 4.0000 mg | Freq: Four times a day (QID) | INTRAMUSCULAR | Status: DC | PRN

## 2019-10-20 MED ORDER — DEXTROSE 50 % IV SOLN: 0.0000 mL | INTRAVENOUS | Status: DC | PRN

## 2019-10-20 MED ORDER — INSULIN GLARGINE 100 UNIT/ML ~~LOC~~ SOLN: 7.0000 [IU] | Freq: Every day | SUBCUTANEOUS | Status: DC

## 2019-10-20 MED ORDER — DEXTROSE 5 % IV SOLN: INTRAVENOUS | Status: DC

## 2019-10-20 MED ORDER — INSULIN REGULAR(HUMAN) IN NACL 100-0.9 UT/100ML-% IV SOLN
INTRAVENOUS | Status: DC
  Administered 2019-10-20: 11 [IU]/h via INTRAVENOUS
  Filled 2019-10-20: qty 100

## 2019-10-20 MED ORDER — DEXTROSE-NACL 5-0.45 % IV SOLN: INTRAVENOUS | Status: DC

## 2019-10-20 MED ORDER — ACETAMINOPHEN 325 MG PO TABS: 650.0000 mg | ORAL_TABLET | Freq: Four times a day (QID) | ORAL | Status: DC | PRN

## 2019-10-20 MED ORDER — SODIUM CHLORIDE 0.9 % IV SOLN
200.0000 mg | Freq: Once | INTRAVENOUS | Status: AC
  Administered 2019-10-20: 200 mg via INTRAVENOUS
  Filled 2019-10-20: qty 40

## 2019-10-20 MED ORDER — INSULIN GLARGINE 100 UNIT/ML ~~LOC~~ SOLN
20.0000 [IU] | Freq: Two times a day (BID) | SUBCUTANEOUS | Status: DC
  Administered 2019-10-20 – 2019-10-21 (×2): 20 [IU] via SUBCUTANEOUS
  Filled 2019-10-20 (×2): qty 0.2

## 2019-10-20 MED ORDER — AMLODIPINE BESYLATE 5 MG PO TABS
5.0000 mg | ORAL_TABLET | Freq: Every day | ORAL | Status: DC
  Administered 2019-10-20 – 2019-10-22 (×3): 5 mg via ORAL
  Filled 2019-10-20 (×3): qty 1

## 2019-10-20 MED ORDER — INSULIN ASPART 100 UNIT/ML ~~LOC~~ SOLN: 0.0000 [IU] | Freq: Every day | SUBCUTANEOUS | Status: DC

## 2019-10-20 NOTE — Progress Notes (Addendum)
[facility] PROGRESS NOTE    Progress Note  Greg Mcgee  TDV:[id number] DOB: 1971/03/25 DOA: 10/19/2019 PCP: [facility]     Brief Narrative:   Greg Mcgee is an 49 y.o. male past medical history significant for uncontrolled diabetes mellitus, chronic renal disease, essential hypertension presents to the ED from the PCP office with worsening renal function, the patient has been experiencing fatigue and malaise since 10/09/2019 tested positive for SARS-CoV-2 on 10/12/2019 went on to develop diarrhea and loss of appetite, with a mild productive cough but not really short of breath.  Labwork from the PCP show worsening renal function and was referred to the ED.  Assessment/Plan:   Acute renal failure superimposed on stage 2 chronic kidney disease (HCC) In the setting of COVID-19, loss of appetite diarrhea, hydrochlorothiazide and ACE inhibitor With a baseline creatinine of around 1.2 on May 2020, started on IV fluid hydration diuretic and ACE inhibitor was stopped. Strict I's and O's and daily weights, basic metabolic panel is pending this morning. Renal ultrasound showed bilateral renal disease no obstructions or mass.  Uncontrolled diabetes mellitus type 2: With a last A1c of 14, blood glucose over 400, starting him on IV insulin drip. Hold metformin.  Pneumonia due to COVID-19/gastroenteritis due to COVID-19 Tested positive on 10/12/2019.  He eventually developed cough Lost appetite and severe diarrhea. Personally reviewed his chest x-ray shows mild bilateral pulmonary infiltrates. We will start him on IV remdesivir. Check saturations with ambulation he related some shortness of breath with exertion.   Essential hypertension Continue Norvasc hold lisinopril and HCTZ.  Leukocytosis: With a mild leukocytosis and a left shift, he is not on steroids, procalcitonin less than 0.1 recheck a CBC tomorrow morning.  DVT prophylaxis: lovenox Family  Communication:none Disposition Plan/Barrier to D/C: Patient is currently having diarrhea he relates some shortness of breath with ambulation.  Code Status:     Code Status Orders  (From admission, onward)         Start     Ordered   10/20/19 0804  Full code  Continuous     10/20/19 0803        Code Status History    This patient has a current code status but no historical code status.   Advance Care Planning Activity    Advance Directive Documentation     Most Recent Value  Type of Advance Directive  Living will  Pre-existing out of facility DNR order (yellow form or pink MOST form)  --  "MOST" Form in Place?  --        IV Access:    Peripheral IV   Procedures and diagnostic studies:   DG CHEST PORT 1 VIEW  Result Date: 10/20/2019 CLINICAL DATA:  COVID-19 with cough EXAM: PORTABLE CHEST 1 VIEW COMPARISON:  02/24/2018 FINDINGS: Subtle patchy subpleural opacity in the left lower lung. No edema or effusion. Normal heart size and mediastinal contours. IMPRESSION: Suspect early infiltrate in the left lung. Electronically Signed   By: Marnee Spring M.D.   On: 10/20/2019 07:33     Medical Consultants:    None.  Anti-Infectives:   IV remdesivir  Subjective:    Greg Mcgee relates his diarrhea has improved he felt a little bit winded with ambulating today.  Objective:    Vitals:   10/20/19 0345 10/20/19 0430 10/20/19 0515 10/20/19 0802  BP: 140/79 126/84 121/81 118/76  Pulse: 91 91 94   Resp: 15 (!) 22 (!) 22   Temp:  98.5 F (36.9 C)  TempSrc:    Oral  SpO2: 97% 97% 94%   Weight:      Height:    5' 10.51" (1.791 m)   SpO2: 94 %  No intake or output data in the 24 hours ending 10/20/19 0836 Filed Weights   10/19/19 2017  Weight: 86.2 kg    Exam: General exam: In no acute distress. Respiratory system: Good air movement and crackles mainly on the right lung Cardiovascular system: S1 & S2 heard, RRR. No JVD. Gastrointestinal system:  Abdomen is nondistended, soft and nontender.  Extremities: No pedal edema. Skin: No rashes, lesions or ulcers Psychiatry: Judgement and insight appear normal. Mood & affect appropriate.    Data Reviewed:    Labs: Basic Metabolic Panel: Recent Labs  Lab 10/19/19 2033 10/20/19 0328  NA 130* 134*  K 3.5 3.6  CL 91* 94*  CO2 25 26  GLUCOSE 435* 419*  BUN 41* 45*  CREATININE 2.88* 2.82*  CALCIUM 8.0* 7.5*   GFR Estimated Creatinine Clearance: 34.4 mL/min (A) (by C-G formula based on SCr of 2.82 mg/dL (H)). Liver Function Tests: Recent Labs  Lab 10/19/19 2033  AST 20  ALT 18  ALKPHOS 63  BILITOT 1.2  PROT 6.6  ALBUMIN 2.2*   No results for input(s): LIPASE, AMYLASE in the last 168 hours. No results for input(s): AMMONIA in the last 168 hours. Coagulation profile No results for input(s): INR, PROTIME in the last 168 hours. COVID-19 Labs  No results for input(s): DDIMER, FERRITIN, LDH, CRP in the last 72 hours.  No results found for: SARSCOV2NAA  CBC: Recent Labs  Lab 10/19/19 2033  WBC 12.5*  NEUTROABS 10.1*  HGB 14.7  HCT 41.4  MCV 80.4  PLT 402*   Cardiac Enzymes: No results for input(s): CKTOTAL, CKMB, CKMBINDEX, TROPONINI in the last 168 hours. BNP (last 3 results) No results for input(s): PROBNP in the last 8760 hours. CBG: Recent Labs  Lab 10/20/19 0802  GLUCAP 418*   D-Dimer: No results for input(s): DDIMER in the last 72 hours. Hgb A1c: No results for input(s): HGBA1C in the last 72 hours. Lipid Profile: No results for input(s): CHOL, HDL, LDLCALC, TRIG, CHOLHDL, LDLDIRECT in the last 72 hours. Thyroid function studies: No results for input(s): TSH, T4TOTAL, T3FREE, THYROIDAB in the last 72 hours.  Invalid input(s): FREET3 Anemia work up: No results for input(s): VITAMINB12, FOLATE, FERRITIN, TIBC, IRON, RETICCTPCT in the last 72 hours. Sepsis Labs: Recent Labs  Lab 10/19/19 2033  WBC 12.5*   Microbiology No results found for  this or any previous visit (from the past 240 hour(s)).   Medications:   . amLODipine  5 mg Oral Daily  . atorvastatin  10 mg Oral q1800  . enoxaparin (LOVENOX) injection  40 mg Subcutaneous Daily  . insulin glargine  7 Units Subcutaneous QHS   Continuous Infusions: . sodium chloride    . sodium chloride    . insulin    . potassium chloride        LOS: 0 days   Charlynne Cousins  [facility]  10/20/2019, 8:36 AM

## 2019-10-20 NOTE — Discharge Planning (Signed)
Pt from [place], [place].   PCP: Marygrace Drought [facility]ley [phone] [pager]; [phone] [office]  Mahayla Haddaway J. Lucretia Roers, RN, BSN, [place] [phone]

## 2019-10-20 NOTE — Plan of Care (Signed)
Patient on room air. No need for supplemental oxygen. Vitals within normal limits.   Problem: Respiratory: Goal: Will maintain a patent airway Outcome: Progressing

## 2019-10-20 NOTE — Progress Notes (Addendum)
Inpatient Diabetes Program Recommendations  AACE/ADA: New Consensus Statement on Inpatient Glycemic Control (2015)  Target Ranges:  Prepandial:   less than 140 mg/dL      Peak postprandial:   less than 180 mg/dL (1-2 hours)      Critically ill patients:  140 - 180 mg/dL   Results for Greg Mcgee, Greg Mcgee (MRN [id number]) as of 10/20/2019 08:49  Ref. Range 10/19/2019 20:33  Sodium Latest Ref Range: 135 - 145 mmol/L 130 (L)  Potassium Latest Ref Range: 3.5 - 5.1 mmol/L 3.5  Chloride Latest Ref Range: 98 - 111 mmol/L 91 (L)  CO2 Latest Ref Range: 22 - 32 mmol/L 25  Glucose Latest Ref Range: 70 - 99 mg/dL 073 (H)  BUN Latest Ref Range: 6 - 20 mg/dL 41 (H)  Creatinine Latest Ref Range: 0.61 - 1.24 mg/dL 7.10 (H)  Calcium Latest Ref Range: 8.9 - 10.3 mg/dL 8.0 (L)  Anion gap Latest Ref Range: 5 - 15  14   Results for Greg Mcgee, Greg Mcgee (MRN [id number]) as of 10/20/2019 08:49  Ref. Range 10/20/2019 08:02  Glucose-Capillary Latest Ref Range: 70 - 99 mg/dL 270 (H)    Admit with: COVID (diagnosed 02/02) with diarrhea, loss of appetite, and abnormal labs/ Acute kidney injury superimposed on CKD II   History: DM  Home DM Meds: 70/30 Insulin 14 units BID       Metformin 2000 mg Daily  Current Orders: Lantus 7 units QHS      IV Insulin Drip    [facility]P: [facility]  Current A1c in process.    MD- Note patient has orders to start the IV Insulin Drip this AM.  1. Please discontinue the orders for the Lantus while patient remains on the IV Insulin Drip  2. May need to stop Metformin at time of discharge given current renal function.  3. Please add Dextrose containing IVF to orders to switch over to once pt's CBG is less than 250 mg/dl while on the IV Insulin Drip      --Will follow patient during hospitalization--  Ambrose Finland RN, MSN, CDE Diabetes Coordinator Inpatient Glycemic Control Team Team Pager: [phone] (8a-5p)

## 2019-10-20 NOTE — ED Notes (Signed)
Called ptar for pt transport to [facility]  

## 2019-10-20 NOTE — H&P (Addendum)
History and Physical    Brenen Beigel NTZ:[id number] DOB: Jan 09, 1971 DOA: 10/19/2019  PCP: [facility]   Patient coming from: Home   Chief Complaint: COVID with diarrhea, loss of appetite, and abnormal labs   HPI: Greg Mcgee is a 49 y.o. male with medical history significant for uncontrolled diabetes mellitus, mild renal insufficiency, and hypertension, now presenting to the emergency department at the direction of his PCP for evaluation of worsened renal function.  Patient reports that he developed fatigue and general malaise on 10/09/2019, tested positive for coronavirus on 10/12/2019, went on to develop diarrhea and loss of appetite without abdominal pain or vomiting, mild nonproductive cough, but no real shortness of breath.  He was seen at the [facility] in follow-up, had blood work, and was informed that his renal function had worsened and directed to the hospital for evaluation of this.  He has continued to take his blood pressure medications including HCTZ-lisinopril, but has not been eating much of anything due to loss of appetite and has continued to have some loose stools.  ED Course: Upon arrival to the ED, patient is found to be afebrile, saturating mid 90s on room air, slightly tachypneic, and mildly tachycardic.  Chemistry panel notable for creatinine of 2.88, up from 1.22 in May.  CBC with mild leukocytosis and thrombocytosis.  The patient was given 2 L normal saline in the ED, tachycardia resolved, chemistry panel was repeated, but there was no improvement in renal function and hospitalists were consulted for admission.  Review of Systems:  All other systems reviewed and apart from HPI, are negative.  Past Medical History:  Diagnosis Date  . COVID-19   . Diabetes mellitus without complication (HCC)   . Hypertension     History reviewed. No pertinent surgical history.   reports that he has never smoked. He has never used smokeless tobacco. He reports previous alcohol use.  He reports that he does not use drugs.  No Known Allergies  Family History  Problem Relation Age of Onset  . Cancer Maternal Grandfather   . Diabetes Maternal Grandfather   . Hypertension Maternal Grandfather   . Cancer Maternal Grandmother   . Diabetes Maternal Grandmother   . Hypertension Maternal Grandmother      Prior to Admission medications   Medication Sig Start Date End Date Taking? Authorizing Provider  amLODipine (NORVASC) 5 MG tablet Take 5 mg by mouth daily.   Yes [provider]  atorvastatin (LIPITOR) 10 MG tablet Take 10 mg by mouth daily at 6 PM.   Yes [provider]  insulin aspart protamine- aspart (NOVOLOG MIX 70/30) (70-30) 100 UNIT/ML injection Inject 14 Units into the skin 2 (two) times daily with a meal.   Yes [provider]  lisinopril-hydrochlorothiazide (ZESTORETIC) 20-25 MG tablet Take 1 tablet by mouth daily.   Yes [provider]  metFORMIN (GLUCOPHAGE) 500 MG tablet Take 2,000 mg by mouth daily with breakfast.   Yes [provider]    Physical Exam: Vitals:   10/20/19 0330 10/20/19 0345 10/20/19 0430 10/20/19 0515  BP: 126/80 140/79 126/84 121/81  Pulse: 88 91 91 94  Resp: 19 15 (!) 22 (!) 22  Temp:      TempSrc:      SpO2: 96% 97% 97% 94%  Weight:         Constitutional: NAD, calm  Eyes: PERTLA, lids and conjunctivae normal ENMT: Mucous membranes are moist. Posterior pharynx clear of any exudate or lesions.   Neck:  normal, supple, no masses, no thyromegaly Respiratory: clear to auscultation bilaterally, no wheezing, no crackles. No accessory muscle use.  Cardiovascular: S1 & S2 heard, regular rate and rhythm. No extremity edema. No significant JVD. Abdomen: No distension, no tenderness, soft. Bowel sounds active.  Musculoskeletal: no clubbing / cyanosis. No joint deformity upper and lower extremities.   Skin: no significant rashes, lesions, ulcers. Warm, dry, well-perfused. Neurologic: CN 2-12  grossly intact. Sensation intact, DTR normal. Strength 5/5 in all 4 limbs.  Psychiatric: Alert and oriented x 3. Pleasant and cooperative.    Labs and Imaging on Admission: I have personally reviewed following labs and imaging studies  CBC: Recent Labs  Lab 10/19/19 2033  WBC 12.5*  NEUTROABS 10.1*  HGB 14.7  HCT 41.4  MCV 80.4  PLT 402*   Basic Metabolic Panel: Recent Labs  Lab 10/19/19 2033 10/20/19 0328  NA 130* 134*  K 3.5 3.6  CL 91* 94*  CO2 25 26  GLUCOSE 435* 419*  BUN 41* 45*  CREATININE 2.88* 2.82*  CALCIUM 8.0* 7.5*   GFR: CrCl cannot be calculated (Unknown ideal weight.). Liver Function Tests: Recent Labs  Lab 10/19/19 2033  AST 20  ALT 18  ALKPHOS 63  BILITOT 1.2  PROT 6.6  ALBUMIN 2.2*   No results for input(s): LIPASE, AMYLASE in the last 168 hours. No results for input(s): AMMONIA in the last 168 hours. Coagulation Profile: No results for input(s): INR, PROTIME in the last 168 hours. Cardiac Enzymes: No results for input(s): CKTOTAL, CKMB, CKMBINDEX, TROPONINI in the last 168 hours. BNP (last 3 results) No results for input(s): PROBNP in the last 8760 hours. HbA1C: No results for input(s): HGBA1C in the last 72 hours. CBG: No results for input(s): GLUCAP in the last 168 hours. Lipid Profile: No results for input(s): CHOL, HDL, LDLCALC, TRIG, CHOLHDL, LDLDIRECT in the last 72 hours. Thyroid Function Tests: No results for input(s): TSH, T4TOTAL, FREET4, T3FREE, THYROIDAB in the last 72 hours. Anemia Panel: No results for input(s): VITAMINB12, FOLATE, FERRITIN, TIBC, IRON, RETICCTPCT in the last 72 hours. Urine analysis:    Component Value Date/Time   LABSPEC 1.025 02/24/2018 1056   PHURINE 5.5 02/24/2018 1056   GLUCOSEU >=1000 (A) 02/24/2018 1056   HGBUR TRACE (A) 02/24/2018 1056   BILIRUBINUR NEGATIVE 02/24/2018 1056   KETONESUR NEGATIVE 02/24/2018 1056   PROTEINUR 100 (A) 02/24/2018 1056   UROBILINOGEN 0.2 02/24/2018 1056    NITRITE NEGATIVE 02/24/2018 1056   LEUKOCYTESUR NEGATIVE 02/24/2018 1056   Sepsis Labs: @LABRCNTIP (procalcitonin:4,lacticidven:4) )No results found for this or any previous visit (from the past 240 hour(s)).   Radiological Exams on Admission: No results found.  Assessment/Plan   1. 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 Uncontrolled IDDM  - Serum glucose is 400's in ED without DKA  - A1c was 14.6% in May 2020  - Hold metformin, continue CBG monitoring and insulin   3. COVID-19 infection  - Patient developed fatigue and malaise on 10/09/19, tested positive for COVID-19  on 10/12/2019 (seen in [place]), and went on to develop cough, loss of appetite, and diarrhea  - He has mild cough, but no other respiratory s/s so will hold off on antiviral, steroid, or other specific treatments for now while maintaining isolation, and monitoring markers   4. Hypertension  - BP at goal, continue Norvasc as tolerated and hold lisinopril-HCTZ as above    5. Leukocytosis  - Sxs include cough and diarrhea with benign abdominal exam  - Check procalctionin and CXR    DVT prophylaxis: Lovenox Code Status: Full  Family Communication: Discussed with patient  Disposition Plan: Likely will return home in 24-48 hrs once taking adequate oral hydration renal function improving.  Consults called: None  Admission status: Observation     Vianne Bulls, MD [facility] Pager: See [email or web address]  If 7AM-7PM, please contact the daytime  attending [email or web address]  10/20/2019, 6:47 AM

## 2019-10-20 NOTE — ED Provider Notes (Signed)
[facility] EMERGENCY DEPARTMENT Provider Note   CSN: [id number] Arrival date & time: 10/19/19  1925     History Chief Complaint  Patient presents with  . dehydration    Greg Mcgee is a 49 y.o. male.  Patient presents to the emergency department with a chief complaint of dehydration.  He states that he was diagnosed with coronavirus almost 2 weeks ago.  He was seen in follow-up at the [place] today, and was told that his kidney function was bad and that he needed to come to the emergency department for evaluation for dehydration.  He states that he continues to have some generalized body aches, but feels better than he has in several days.  He also still has some persistent coughing, but states that this has also improved.  Denies any fevers today.  States he still feels some fatigue.  He states that he has had diarrhea, but this has been slowing down.  He denies any vomiting.  The history is provided by the patient. No language interpreter was used.       Past Medical History:  Diagnosis Date  . COVID-19   . Diabetes mellitus without complication ([place])   . Hypertension     There are no problems to display for this patient.   History reviewed. No pertinent surgical history.     Family History  Problem Relation Age of Onset  . Cancer Maternal Grandfather   . Diabetes Maternal Grandfather   . Hypertension Maternal Grandfather   . Cancer Maternal Grandmother   . Diabetes Maternal Grandmother   . Hypertension Maternal Grandmother     Social History   Tobacco Use  . Smoking status: Never Smoker  . Smokeless tobacco: Never Used  Substance Use Topics  . Alcohol use: Not Currently  . Drug use: Never    Home Medications Prior to Admission medications   Medication Sig Start Date End Date Taking? Authorizing Provider  amLODipine (NORVASC) 5 MG tablet Take 5 mg by mouth daily.    [provider]  glipiZIDE (GLUCOTROL) 10 MG tablet Take 10 mg by  mouth daily before breakfast.    [provider]  lisinopril-hydrochlorothiazide (PRINZIDE,ZESTORETIC) 10-12.5 MG tablet Take 1 tablet by mouth daily.    [provider]    Allergies    Patient has no known allergies.  Review of Systems   Review of Systems  All other systems reviewed and are negative.   Physical Exam Updated Vital Signs BP 107/74 (BP Location: Left Arm)   Pulse 95   Temp 98.4 F (36.9 C) (Oral)   Resp 18   Wt 86.2 kg   SpO2 96%   Physical Exam Vitals and nursing note reviewed.  Constitutional:      Appearance: He is well-developed.  HENT:     Head: Normocephalic and atraumatic.  Eyes:     Conjunctiva/sclera: Conjunctivae normal.  Cardiovascular:     Rate and Rhythm: Normal rate and regular rhythm.     Heart sounds: No murmur.  Pulmonary:     Effort: Pulmonary effort is normal. No respiratory distress.     Breath sounds: Normal breath sounds.  Abdominal:     Palpations: Abdomen is soft.     Tenderness: There is no abdominal tenderness.  Musculoskeletal:        General: Normal range of motion.     Cervical back: Neck supple.  Skin:    General: Skin is warm and dry.  Neurological:  Mental Status: He is alert and oriented to person, place, and time.  Psychiatric:        Mood and Affect: Mood normal.        Behavior: Behavior normal.     ED Results / Procedures / Treatments   Labs (all labs ordered are listed, but only abnormal results are displayed) Labs Reviewed  CBC WITH DIFFERENTIAL/PLATELET - Abnormal; Notable for the following components:      Result Value   WBC 12.5 (*)    RDW 11.2 (*)    Platelets 402 (*)    Neutro Abs 10.1 (*)    All other components within normal limits  COMPREHENSIVE METABOLIC PANEL - Abnormal; Notable for the following components:   Sodium 130 (*)    Chloride 91 (*)    Glucose, Bld 435 (*)    BUN 41 (*)    Creatinine, Ser 2.88 (*)    Calcium 8.0 (*)    Albumin 2.2 (*)    GFR calc non  Af Amer 25 (*)    GFR calc Af Amer 29 (*)    All other components within normal limits  BASIC METABOLIC PANEL - Abnormal; Notable for the following components:   Sodium 134 (*)    Chloride 94 (*)    Glucose, Bld 419 (*)    BUN 45 (*)    Creatinine, Ser 2.82 (*)    Calcium 7.5 (*)    GFR calc non Af Amer 26 (*)    GFR calc Af Amer 30 (*)    All other components within normal limits    EKG None  Radiology No results found.  Procedures Ultrasound ED Peripheral IV (Provider)  Date/Time: 10/20/2019 2:02 AM Performed by: Roxy Horseman, PA-C Authorized by: Roxy Horseman, PA-C   Procedure details:    Indications: hydration and multiple failed IV attempts     Skin Prep: chlorhexidine gluconate     Location: left upper arm.   Angiocath:  18 G   Bedside Ultrasound Guided: Yes     Images: archived     Patient tolerated procedure without complications: Yes     Dressing applied: Yes     (including critical care time)  Medications Ordered in ED Medications  sodium chloride 0.9 % bolus 1,000 mL (has no administration in time range)    ED Course  I have reviewed the triage vital signs and the nursing notes.  Pertinent labs & imaging results that were available during my care of the patient were reviewed by me and considered in my medical decision making (see chart for details).    MDM Rules/Calculators/A&P                      Patient sent from the [facility] for dehydration and concern for acute kidney injury. Patient recently tested positive for coronavirus. He states that he has been improving with regard to his symptoms, but has had some lingering diarrhea. He states that this is also improving. Denies any recent fevers over the past couple of days. States that at a follow-up visit at the [place] clinic, he was told that his kidney function was bad and that he needed to go to the emergency department for evaluation.  Today's creatinine is 2.84, GFR is 30. NA is 130, CL 91, and  glucose is poorly controlled at 435.  Patient given 2 L of fluid. Recheck of BMP shows that creatinine is 2.82. Not significantly improved. Discussed the case with Dr. Elesa Massed,  who recommends admission. Is unclear whether the patient's kidney function is acute or not based on lack of records. He states that he has not been seen by anyone at the [place] in the past year except for yesterday.  Case discussed with Dr. Antionette Char, who is appreciated for bringing patient into the hospital for questionable new AKI in the setting of COVID-19 and diarrhea.  Final Clinical Impression(s) / ED Diagnoses Final diagnoses:  AKI (acute kidney injury) [facility])    Rx / DC Orders ED Discharge Orders    None       Roxy Horseman, PA-C 10/20/19 0602    Ward, Layla Maw, DO 10/20/19 [phone]

## 2019-10-21 DIAGNOSIS — U071 COVID-19: Principal | ICD-10-CM

## 2019-10-21 DIAGNOSIS — Z794 Long term (current) use of insulin: Secondary | ICD-10-CM

## 2019-10-21 DIAGNOSIS — E1100 Type 2 diabetes mellitus with hyperosmolarity without nonketotic hyperglycemic-hyperosmolar coma (NKHHC): Secondary | ICD-10-CM

## 2019-10-21 DIAGNOSIS — A09 Infectious gastroenteritis and colitis, unspecified: Secondary | ICD-10-CM

## 2019-10-21 DIAGNOSIS — E119 Type 2 diabetes mellitus without complications: Secondary | ICD-10-CM

## 2019-10-21 LAB — CBC WITH DIFFERENTIAL/PLATELET
Abs Immature Granulocytes: 0.06 10*3/uL (ref 0.00–0.07)
Basophils Absolute: 0 10*3/uL (ref 0.0–0.1)
Basophils Relative: 0 %
Eosinophils Absolute: 0.1 10*3/uL (ref 0.0–0.5)
Eosinophils Relative: 1 %
HCT: 37.6 % — ABNORMAL LOW (ref 39.0–52.0)
Hemoglobin: 13.1 g/dL (ref 13.0–17.0)
Immature Granulocytes: 1 %
Lymphocytes Relative: 22 %
Lymphs Abs: 2 10*3/uL (ref 0.7–4.0)
MCH: 28.2 pg (ref 26.0–34.0)
MCHC: 34.8 g/dL (ref 30.0–36.0)
MCV: 80.9 fL (ref 80.0–100.0)
Monocytes Absolute: 0.9 10*3/uL (ref 0.1–1.0)
Monocytes Relative: 10 %
Neutro Abs: 6 10*3/uL (ref 1.7–7.7)
Neutrophils Relative %: 66 %
Platelets: 422 10*3/uL — ABNORMAL HIGH (ref 150–400)
RBC: 4.65 MIL/uL (ref 4.22–5.81)
RDW: 11.2 % — ABNORMAL LOW (ref 11.5–15.5)
WBC: 9 10*3/uL (ref 4.0–10.5)
nRBC: 0 % (ref 0.0–0.2)

## 2019-10-21 LAB — BASIC METABOLIC PANEL
Anion gap: 10 (ref 5–15)
BUN: 43 mg/dL — ABNORMAL HIGH (ref 6–20)
CO2: 26 mmol/L (ref 22–32)
Calcium: 7.9 mg/dL — ABNORMAL LOW (ref 8.9–10.3)
Chloride: 103 mmol/L (ref 98–111)
Creatinine, Ser: 1.89 mg/dL — ABNORMAL HIGH (ref 0.61–1.24)
GFR calc Af Amer: 48 mL/min — ABNORMAL LOW (ref >60)
GFR calc non Af Amer: 42 mL/min — ABNORMAL LOW (ref >60)
Glucose, Bld: 163 mg/dL — ABNORMAL HIGH (ref 70–99)
Potassium: 3.4 mmol/L — ABNORMAL LOW (ref 3.5–5.1)
Sodium: 139 mmol/L (ref 135–145)

## 2019-10-21 LAB — C-REACTIVE PROTEIN: CRP: 11.2 mg/dL — ABNORMAL HIGH (ref <1.0)

## 2019-10-21 LAB — D-DIMER, QUANTITATIVE: D-Dimer, Quant: 0.37 ug/mL-FEU (ref 0.00–0.50)

## 2019-10-21 LAB — GLUCOSE, CAPILLARY
Glucose-Capillary: 200 mg/dL — ABNORMAL HIGH (ref 70–99)
Glucose-Capillary: 216 mg/dL — ABNORMAL HIGH (ref 70–99)
Glucose-Capillary: 213 mg/dL — ABNORMAL HIGH (ref 70–99)

## 2019-10-21 LAB — HEMOGLOBIN A1C
Hgb A1c MFr Bld: 12.3 % — ABNORMAL HIGH (ref 4.8–5.6)
Mean Plasma Glucose: 306 mg/dL

## 2019-10-21 LAB — UREA NITROGEN, URINE: Urea Nitrogen, Ur: 937 mg/dL

## 2019-10-21 MED ORDER — SODIUM CHLORIDE 0.9 % IV SOLN: INTRAVENOUS | Status: AC

## 2019-10-21 MED ORDER — POTASSIUM CHLORIDE CRYS ER 20 MEQ PO TBCR
20.0000 meq | EXTENDED_RELEASE_TABLET | Freq: Two times a day (BID) | ORAL | Status: DC
  Administered 2019-10-21 – 2019-10-22 (×3): 20 meq via ORAL
  Filled 2019-10-21 (×3): qty 1

## 2019-10-21 MED ORDER — INSULIN GLARGINE 100 UNIT/ML ~~LOC~~ SOLN
25.0000 [IU] | Freq: Two times a day (BID) | SUBCUTANEOUS | Status: DC
  Administered 2019-10-21 – 2019-10-22 (×2): 25 [IU] via SUBCUTANEOUS
  Filled 2019-10-21 (×3): qty 0.25

## 2019-10-21 MED ORDER — INSULIN ASPART 100 UNIT/ML ~~LOC~~ SOLN
8.0000 [IU] | Freq: Three times a day (TID) | SUBCUTANEOUS | Status: DC
  Administered 2019-10-21 – 2019-10-22 (×3): 8 [IU] via SUBCUTANEOUS

## 2019-10-21 NOTE — Progress Notes (Signed)
Inpatient Diabetes Program Recommendations  AACE/ADA: New Consensus Statement on Inpatient Glycemic Control (2015)  Target Ranges:  Prepandial:   less than 140 mg/dL      Peak postprandial:   less than 180 mg/dL (1-2 hours)      Critically ill patients:  140 - 180 mg/dL   Lab Results  Component Value Date   GLUCAP 216 (H) 10/21/2019   HGBA1C 12.3 (H) 10/20/2019    Review of Glycemic Control Results for DURELLE, ZEPEDA (MRN [id number]) as of 10/21/2019 15:29  Ref. Range 10/20/2019 20:54 10/21/2019 07:48 10/21/2019 11:34  Glucose-Capillary Latest Ref Range: 70 - 99 mg/dL 952 (H) 841 (H) 324 (H)    Consider increasing Levemir to 24 units BID.   Spoke with patient regarding outpatient diabetes management. He is followed by the [place] in [place], but has not seen an endocrinologist in over a year. At the last visit, he was instructed to take 70/30 based off of a sliding scale. States, "Because of my difficult schedule, I would try to take it with lunch and with a late dinner as needed." Reviewed patient's current A1c of 12.3%. Explained what a A1c is and what it measures. Also reviewed goal A1c with patient, importance of good glucose control @ home, and blood sugar goals. Reviewed patho of DM, need for insulin, role of pancreas, impact of infection on glucose trends, vascular changes, and commorbidities.  Patient has a meter but was struggling to use it. Patient reports having unusual work hours and ability to check blood sugars. Discussed Freestyle Libre benefits, action, cost and application. Patient feels this tool would be so helpful in providing data for adjustments. Reviewed frequency of checks and when to call MD. Patient to follow up with PCP in next 2 weeks. Will plan to attach goals and outpatient endo list (as patient can seek providers outside [place] network and is double covered with insurance).  Reports being mindful of sugar intake, avoids sugary beverages and tries to have vegetables at  every meal.   Contacted MD regarding outpatient insulin dosages, as may require adjustment given current inpatient trends. Order provided for CGM placement.   Thanks, Lujean Rave, MSN, RNC-OB Diabetes Coordinator [phone] (8a-5p)

## 2019-10-21 NOTE — Progress Notes (Signed)
Patient scheduled for outpatient Remdesivir infusion at 10:00 AM on Saturday 2/13 and Sunday 2/14.  Please advise them to report to [facility] at [address]  Drive to the security guard and tell them you are here for an infusion. They will direct you to the front entrance where we will come and get you.  For questions call [phone].  Thanks

## 2019-10-21 NOTE — Discharge Instructions (Addendum)
[facility]in[facility] [phone]) 1. Dr. Philemon Kingdom 2. Dr. Janie [facility] [phone]) 1. Dr. Delrae Rend [facility] [phone]) 1. Dr. Jacelyn Pi 2. Dr. Anda Kraft [facility] [phone]) 1. Dr. Daneil Dolin [facility] [phone]) [[place] office]  [phone]) [[place] office] 1. Dr. Lenna Sciara Solum 2. Dr. Mee Hives [facility]) [phone]) 1. Autumn Hudnall Ronnald Ramp), PA 2. Dr. Amalia Greenhouse 3. Dr. Marsh Dolly. [facility] [phone]) 1. Dr. Glade Lloyd [facility] [phone]) 1. Dr. Orville Govern 2. Dr. Lelon Huh 3. Dr. Jerelene Redden 4. Alwyn Ren, FNP Dr. Carolynn Serve. [facility] in [place] [phone])   Preventing Diabetes Mellitus Complications You can take action to prevent or slow down problems that are caused by diabetes (diabetes mellitus). Following your diabetes plan and taking care of yourself can reduce your risk of serious or life-threatening complications. What actions can I take to prevent diabetes complications? Manage your diabetes   Follow instructions from your health care providers about managing your diabetes. Your diabetes may be managed by a team of health care providers who can teach you how to care for yourself and can answer questions that you have.  Educate yourself about your condition so you can make healthy choices about eating and physical activity.  Check your blood sugar (glucose) levels as often as directed. Your health care provider will help you decide how often to check your blood glucose level depending on your treatment goals and how well you are meeting them.  Ask your health care provider if you should take low-dose aspirin daily and what dose is recommended for you. Taking low-dose aspirin daily is  recommended to help prevent cardiovascular disease. Do not use nicotine or tobacco Do not use any products that contain nicotine or tobacco, such as cigarettes and e-cigarettes. If you need help quitting, ask your health care provider. Nicotine raises your risk for diabetes problems. If you quit using nicotine:  You will lower your risk for heart attack, stroke, nerve disease, and kidney disease.  Your cholesterol and blood pressure may improve.  Your blood circulation will improve. Keep your blood pressure under control Your personal target blood pressure is determined based on:  Your age.  Your medicines.  How long you have had diabetes.  Any other medical conditions you have. To control your blood pressure:  Follow instructions from your health care provider about meal planning, exercise, and medicines.  Make sure your health care provider checks your blood pressure at every medical visit.  Monitor your blood pressure at home as told by your health care provider.  Keep your cholesterol under control To control your cholesterol:  Follow instructions from your health care provider about meal planning, exercise, and medicines.  Have your cholesterol checked at least once a year.  You may be prescribed medicine to lower cholesterol (statin). If you are not taking a statin, ask your health care provider if you should be. Controlling your cholesterol may:  Help prevent heart disease and stroke. These are the most common health problems for people with diabetes.  Improve your blood flow. Schedule and keep yearly physical exams and eye exams Your health care provider will tell you how often you need medical visits depending on your diabetes management plan. Keep all follow-up visits as directed. This is important so possible problems can be identified early and complications can be avoided or treated.  Every visit with your health  care provider should include measuring  your: ? Weight. ? Blood pressure. ? Blood glucose control.  Your A1c (hemoglobin A1c) level should be checked: ? At least 2 times a year, if you are meeting your treatment goals. ? 4 times a year, if you are not meeting treatment goals or if your treatment goals have changed.  Your blood lipids (lipid profile) should be checked yearly. You should also be checked yearly for protein in your urine (urine microalbumin).  If you have type 1 diabetes, get an eye exam 3-5 years after you are diagnosed, and then once a year after your first exam.  If you have type 2 diabetes, get an eye exam as soon as you are diagnosed, and then once a year after your first exam. Keep your vaccines current It is recommended that you receive:  A flu (influenza) vaccine every year.  A pneumonia (pneumococcal) vaccine and a hepatitis B vaccine. If you are age 25 or older, you may get the pneumonia vaccine as a series of two separate shots. Ask your health care provider which other vaccines may be recommended. Take care of your feet Diabetes may cause you to have poor blood circulation to your legs and feet. Because of this, taking care of your feet is very important. Diabetes can cause:  The skin on the feet to get thinner, break more easily, and heal more slowly.  Nerve damage in your legs and feet, which results in decreased feeling. You may not notice minor injuries that could lead to serious problems. To avoid foot problems:  Check your skin and feet every day for cuts, bruises, redness, blisters, or sores.  Schedule a foot exam with your health care provider once every year. This exam includes: ? Inspecting of the structure and skin of your feet. ? Checking the pulses and sensation in your feet.  Make sure that your health care provider performs a visual foot exam at every medical visit.  Take care of your teeth People with poorly controlled diabetes are more likely to have gum (periodontal) disease.  Diabetes can make periodontal diseases harder to control. If not treated, periodontal diseases can lead to tooth loss. To prevent this:  Brush your teeth twice a day.  Floss at least once a day.  Visit your dentist 2 times a year. Drink responsibly Limit alcohol intake to no more than 1 drink a day for nonpregnant women and 2 drinks a day for men. One drink equals 12 oz of beer, 5 oz of wine, or 1 oz of hard liquor.  It is important to eat food when you drink alcohol to avoid low blood glucose (hypoglycemia). Avoid alcohol if you:  Have a history of alcohol abuse or dependence.  Are pregnant.  Have liver disease, pancreatitis, advanced neuropathy, or severe hypertriglyceridemia. Lessen stress Living with diabetes can be stressful. When you are experiencing stress, your blood glucose may be affected in two ways:  Stress hormones may cause your blood glucose to rise.  You may be distracted from taking good care of yourself. Be aware of your stress level and make changes to help you manage challenging situations. To lower your stress levels:  Consider joining a support group.  Do planned relaxation or meditation.  Do a hobby that you enjoy.  Maintain healthy relationships.  Exercise regularly.  Work with your health care provider or a mental health professional. Summary  You can take action to prevent or slow down problems that are caused by diabetes (  diabetes mellitus). Following your diabetes plan and taking care of yourself can reduce your risk of serious or life-threatening complications.  Follow instructions from your health care providers about managing your diabetes. Your diabetes may be managed by a team of health care providers who can teach you how to care for yourself and can answer questions that you have.  Your health care provider will tell you how often you need medical visits depending on your diabetes management plan. Keep all follow-up visits as directed. This  is important so possible problems can be identified early and complications can be avoided or treated. This information is not intended to replace advice given to you by your health care provider. Make sure you discuss any questions you have with your health care provider. Document Revised: 11/24/2017 Document Reviewed: 05/25/2016 Elsevier Patient Education  [place]. Hypoglycemia Hypoglycemia is when the sugar (glucose) level in your blood is too low. Signs of low blood sugar may include:  Feeling: ? Hungry. ? Worried or nervous (anxious). ? Sweaty and clammy. ? Confused. ? Dizzy. ? Sleepy. ? Sick to your stomach (nauseous).  Having: ? A fast heartbeat. ? A headache. ? A change in your vision. ? Tingling or no feeling (numbness) around your mouth, lips, or tongue. ? Jerky movements that you cannot control (seizure).  Having trouble with: ? Moving (coordination). ? Sleeping. ? Passing out (fainting). ? Getting upset easily (irritability). Low blood sugar can happen to people who have diabetes and people who do not have diabetes. Low blood sugar can happen quickly, and it can be an emergency. Treating low blood sugar Low blood sugar is often treated by eating or drinking something sugary right away, such as:  Fruit juice, 4-6 oz (120-150 mL).  Regular soda (not diet soda), 4-6 oz (120-150 mL).  Low-fat milk, 4 oz (120 mL).  Several pieces of hard candy.  Sugar or honey, 1 Tbsp (15 mL). Treating low blood sugar if you have diabetes If you can think clearly and swallow safely, follow the 15:15 rule:  Take 15 grams of a fast-acting carb (carbohydrate). Talk with your doctor about how much you should take.  Always keep a source of fast-acting carb with you, such as: ? Sugar tablets (glucose pills). Take 3-4 pills. ? 6-8 pieces of hard candy. ? 4-6 oz (120-150 mL) of fruit juice. ? 4-6 oz (120-150 mL) of regular (not diet) soda. ? 1 Tbsp (15 mL) honey or  sugar.  Check your blood sugar 15 minutes after you take the carb.  If your blood sugar is still at or below 70 mg/dL (3.9 mmol/L), take 15 grams of a carb again.  If your blood sugar does not go above 70 mg/dL (3.9 mmol/L) after 3 tries, get help right away.  After your blood sugar goes back to normal, eat a meal or a snack within 1 hour.  Treating very low blood sugar If your blood sugar is at or below 54 mg/dL (3 mmol/L), you have very low blood sugar (severe hypoglycemia). This may also cause:  Passing out.  Jerky movements you cannot control (seizure).  Losing consciousness (coma). This is an emergency. Do not wait to see if the symptoms will go away. Get medical help right away. Call your local emergency services (911 in the [place] Do not drive yourself to the hospital. If you have very low blood sugar and you cannot eat or drink, you may need a glucagon shot (injection). A family member or friend  should learn how to check your blood sugar and how to give you a glucagon shot. Ask your doctor if you need to have a glucagon shot kit at home. Follow these instructions at home: General instructions  Take over-the-counter and prescription medicines only as told by your doctor.  Stay aware of your blood sugar as told by your doctor.  Limit alcohol intake to no more than 1 drink a day for nonpregnant women and 2 drinks a day for men. One drink equals 12 oz of beer (355 mL), 5 oz of wine (148 mL), or 1 oz of hard liquor (44 mL).  Keep all follow-up visits as told by your doctor. This is important. If you have diabetes:   Follow your diabetes care plan as told by your doctor. Make sure you: ? Know the signs of low blood sugar. ? Take your medicines as told. ? Follow your exercise and meal plan. ? Eat on time. Do not skip meals. ? Check your blood sugar as often as told by your doctor. Always check it before and after exercise. ? Follow your sick day plan when you cannot eat or  drink normally. Make this plan ahead of time with your doctor.  Share your diabetes care plan with: ? Your work or school. ? People you live with.  Check your pee (urine) for ketones: ? When you are sick. ? As told by your doctor.  Carry a card or wear jewelry that says you have diabetes. Contact a doctor if:  You have trouble keeping your blood sugar in your target range.  You have low blood sugar often. Get help right away if:  You still have symptoms after you eat or drink something sugary.  Your blood sugar is at or below 54 mg/dL (3 mmol/L).  You have jerky movements that you cannot control.  You pass out. These symptoms may be an emergency. Do not wait to see if the symptoms will go away. Get medical help right away. Call your local emergency services (911 in the [place] Do not drive yourself to the hospital. Summary  Hypoglycemia happens when the level of sugar (glucose) in your blood is too low.  Low blood sugar can happen to people who have diabetes and people who do not have diabetes. Low blood sugar can happen quickly, and it can be an emergency.  Make sure you know the signs of low blood sugar and know how to treat it.  Always keep a source of sugar (fast-acting carb) with you to treat low blood sugar. This information is not intended to replace advice given to you by your health care provider. Make sure you discuss any questions you have with your health care provider. Document Revised: 12/17/2018 Document Reviewed: 09/29/2015 Elsevier Patient Education  [place]. Hyperglycemia Hyperglycemia occurs when the level of sugar (glucose) in the blood is too high. Glucose is a type of sugar that provides the body's main source of energy. Certain hormones (insulin and glucagon) control the level of glucose in the blood. Insulin lowers blood glucose, and glucagon increases blood glucose. Hyperglycemia can result from having too little insulin in the bloodstream, or  from the body not responding normally to insulin. Hyperglycemia occurs most often in people who have diabetes (diabetes mellitus), but it can happen in people who do not have diabetes. It can develop quickly, and it can be life-threatening if it causes you to become severely dehydrated (diabetic ketoacidosis or hyperglycemic hyperosmolar state). Severe hyperglycemia is  a medical emergency. What are the causes? If you have diabetes, hyperglycemia may be caused by:  Diabetes medicine.  Medicines that increase blood glucose or affect your diabetes control.  Not eating enough, or not eating often enough.  Changes in physical activity level.  Being sick or having an infection. If you have prediabetes or undiagnosed diabetes:  Hyperglycemia may be caused by those conditions. If you do not have diabetes, hyperglycemia may be caused by:  Certain medicines, including steroid medicines, beta-blockers, epinephrine, and thiazide diuretics.  Stress.  Serious illness.  Surgery.  Diseases of the pancreas.  Infection. What increases the risk? Hyperglycemia is more likely to develop in people who have risk factors for diabetes, such as:  Having a family member with diabetes.  Having a gene for type 1 diabetes that is passed from parent to child (inherited).  Living in an area with cold weather conditions.  Exposure to certain viruses.  Certain conditions in which the body's disease-fighting (immune) system attacks itself (autoimmune disorders).  Being overweight or obese.  Having an inactive (sedentary) lifestyle.  Having been diagnosed with insulin resistance.  Having a history of prediabetes, gestational diabetes, or polycystic ovarian syndrome (PCOS).  Being of American-Indian, African-American, Hispanic/Latino, or Asian/Pacific Islander descent. What are the signs or symptoms? Hyperglycemia may not cause any symptoms. If you do have symptoms, they may include early warning  signs, such as:  Increased thirst.  Hunger.  Feeling very tired.  Needing to urinate more often than usual.  Blurry vision. Other symptoms may develop if hyperglycemia gets worse, such as:  Dry mouth.  Loss of appetite.  Fruity-smelling breath.  Weakness.  Unexpected or rapid weight gain or weight loss.  Tingling or numbness in the hands or feet.  Headache.  Skin that does not quickly return to normal after being lightly pinched and released (poor skin turgor).  Abdominal pain.  Cuts or bruises that are slow to heal. How is this diagnosed? Hyperglycemia is diagnosed with a blood test to measure your blood glucose level. This blood test is usually done while you are having symptoms. Your health care provider may also do a physical exam and review your medical history. You may have more tests to determine the cause of your hyperglycemia, such as:  A fasting blood glucose (FBG) test. You will not be allowed to eat (you will fast) for at least 8 hours before a blood sample is taken.  An A1c (hemoglobin A1c) blood test. This provides information about blood glucose control over the previous 2-3 months.  An oral glucose tolerance test (OGTT). This measures your blood glucose at two times: ? After fasting. This is your baseline blood glucose level. ? Two hours after drinking a beverage that contains glucose. How is this treated? Treatment depends on the cause of your hyperglycemia. Treatment may include:  Taking medicine to regulate your blood glucose levels. If you take insulin or other diabetes medicines, your medicine or dosage may be adjusted.  Lifestyle changes, such as exercising more, eating healthier foods, or losing weight.  Treating an illness or infection, if this caused your hyperglycemia.  Checking your blood glucose more often.  Stopping or reducing steroid medicines, if these caused your hyperglycemia. If your hyperglycemia becomes severe and it results  in hyperglycemic hyperosmolar state, you must be hospitalized and given IV fluids. Follow these instructions at home:  General instructions  Take over-the-counter and prescription medicines only as told by your health care provider.  Do not use  any products that contain nicotine or tobacco, such as cigarettes and e-cigarettes. If you need help quitting, ask your health care provider.  Limit alcohol intake to no more than 1 drink per day for nonpregnant women and 2 drinks per day for men. One drink equals 12 oz of beer, 5 oz of wine, or 1 oz of hard liquor.  Learn to manage stress. If you need help with this, ask your health care provider.  Keep all follow-up visits as told by your health care provider. This is important. Eating and drinking   Maintain a healthy weight.  Exercise regularly, as directed by your health care provider.  Stay hydrated, especially when you exercise, get sick, or spend time in hot temperatures.  Eat healthy foods, such as: ? Lean proteins. ? Complex carbohydrates. ? Fresh fruits and vegetables. ? Low-fat dairy products. ? Healthy fats.  Drink enough fluid to keep your urine clear or pale yellow. If you have diabetes:  Make sure you know the symptoms of hyperglycemia.  Follow your diabetes management plan, as told by your health care provider. Make sure you: ? Take your insulin and medicines as directed. ? Follow your exercise plan. ? Follow your meal plan. Eat on time, and do not skip meals. ? Check your blood glucose as often as directed. Make sure to check your blood glucose before and after exercise. If you exercise longer or in a different way than usual, check your blood glucose more often. ? Follow your sick day plan whenever you cannot eat or drink normally. Make this plan in advance with your health care provider.  Share your diabetes management plan with people in your workplace, school, and household.  Check your urine for ketones when  you are ill and as told by your health care provider.  Carry a medical alert card or wear medical alert jewelry. Contact a health care provider if:  Your blood glucose is at or above 240 mg/dL (13.3 mmol/L) for 2 days in a row.  You have problems keeping your blood glucose in your target range.  You have frequent episodes of hyperglycemia. Get help right away if:  You have difficulty breathing.  You have a change in how you think, feel, or act (mental status).  You have nausea or vomiting that does not go away. These symptoms may represent a serious problem that is an emergency. Do not wait to see if the symptoms will go away. Get medical help right away. Call your local emergency services (911 in the [place] Do not drive yourself to the hospital. Summary  Hyperglycemia occurs when the level of sugar (glucose) in the blood is too high.  Hyperglycemia is diagnosed with a blood test to measure your blood glucose level. This blood test is usually done while you are having symptoms. Your health care provider may also do a physical exam and review your medical history.  If you have diabetes, follow your diabetes management plan as told by your health care provider.  Contact your health care provider if you have problems keeping your blood glucose in your target range. This information is not intended to replace advice given to you by your health care provider. Make sure you discuss any questions you have with your health care provider. Document Revised: 05/13/2016 Document Reviewed: 05/13/2016 Elsevier Patient Education  [place]. Hemoglobin A1c Test Why am I having this test? You may have the hemoglobin A1c test (HbA1c test) done to:  Evaluate your risk for  developing diabetes (diabetes mellitus).  Diagnose diabetes.  Monitor long-term control of blood sugar (glucose) in people who have diabetes and help make treatment decisions. This test may be done with other blood  glucose tests, such as fasting blood glucose and oral glucose tolerance tests. What is being tested? Hemoglobin is a type of protein in the blood that carries oxygen. Glucose attaches to hemoglobin to form glycated hemoglobin. This test checks the amount of glycated hemoglobin in your blood, which is a good indicator of the average amount of glucose in your blood during the past 2-3 months. What kind of sample is taken?  A blood sample is required for this test. It is usually collected by inserting a needle into a blood vessel. Tell a health care provider about:  All medicines you are taking, including vitamins, herbs, eye drops, creams, and over-the-counter medicines.  Any blood disorders you have.  Any surgeries you have had.  Any medical conditions you have.  Whether you are pregnant or may be pregnant. How are the results reported? Your results will be reported as a percentage that indicates how much of your hemoglobin has glucose attached to it (is glycated). Your health care provider will compare your results to normal ranges that were established after testing a large group of people (reference ranges). Reference ranges may vary among labs and hospitals. For this test, common reference ranges are:  Adult or child without diabetes: 4-5.6%.  Adult or child with diabetes and good blood glucose control: less than 7%. What do the results mean? If you have diabetes:  A result of less than 7% is considered normal, meaning that your blood glucose is well controlled.  A result higher than 7% means that your blood glucose is not well controlled, and your treatment plan may need to be adjusted. If you do not have diabetes:  A result within the reference range is considered normal, meaning that you are not at high risk for diabetes.  A result of 5.7-6.4% means that you have a high risk of developing diabetes, and you may have prediabetes. Prediabetes is the condition of having a blood  glucose level that is higher than it should be, but not high enough for you to be diagnosed with diabetes. Having prediabetes puts you at risk for developing type 2 diabetes (type 2 diabetes mellitus). You may have more tests, including a repeat HbA1c test.  Results of 6.5% or higher on two separate HbA1c tests mean that you have diabetes. You may have more tests to confirm the diagnosis. Abnormally low HbA1c values may be caused by:  Pregnancy.  Severe blood loss.  Receiving donated blood (transfusions).  Low red blood cell count (anemia).  Long-term kidney failure.  Some unusual forms (variants) of hemoglobin. Talk with your health care provider about what your results mean. Questions to ask your health care provider Ask your health care provider, or the department that is doing the test:  When will my results be ready?  How will I get my results?  What are my treatment options?  What other tests do I need?  What are my next steps? Summary  The hemoglobin A1c test (HbA1c test) may be done to evaluate your risk for developing diabetes, to diagnose diabetes, and to monitor long-term control of blood sugar (glucose) in people who have diabetes and help make treatment decisions.  Hemoglobin is a type of protein in the blood that carries oxygen. Glucose attaches to hemoglobin to form glycated hemoglobin. This  test checks the amount of glycated hemoglobin in your blood, which is a good indicator of the average amount of glucose in your blood during the past 2-3 months.  Talk with your health care provider about what your results mean. This information is not intended to replace advice given to you by your health care provider. Make sure you discuss any questions you have with your health care provider. Document Revised: 08/08/2017 Document Reviewed: 04/08/2017 Elsevier Patient Education  2020 [place] are scheduled for an outpatient infusion of Remdesivir at 10:00  AM on Saturday 2/13 and Sunday 2/14.  Please report to [facility] at [address]  Drive to the security guard and tell them you are here for an infusion. They will direct you to the front entrance where we will come and get you.  For questions call [phone].  Thanks   Goals for Diabetes management:  1. Begin checking blood sugars 2-3 times per day at minimum. Once at start of day, with lunchtime injection and once with late dinner injection. If able with Freestyle, check more frequently, especially 2 hours following meals.  2. Take insulin as prescribed.  3. Contact PCP for appointment. Consider outpatient endocrinologist.  4. Call if more than 1 low blood sugar a week OR if blood sugar >200 consistently.       Greg Mcgee was admitted to the Hospital on 10/19/2019 and Discharged on Discharge Date 10/22/2019 and should be excused from work/school   for 4   days starting 10/19/2019 , may return to work/school without any restrictions.  Call Bess Harvest MD, [place] [facility] [phone] with questions.  Charlynne Cousins M.D on 10/22/2019,at 12:38 PM  [facility] Office  [phone]

## 2019-10-21 NOTE — Progress Notes (Signed)
SATURATION QUALIFICATIONS: (This note is used to comply with regulatory documentation for home oxygen)  Patient Saturations on Room Air at Rest = 96%  Patient Saturations on Room Air while Ambulating = 90-97%  Patient Saturations on 0 Liters of oxygen while Ambulating   Please briefly explain why patient needs home oxygen:

## 2019-10-21 NOTE — Progress Notes (Signed)
[facility] PROGRESS NOTE    Progress Note  Greg Mcgee  WER:[id number] DOB: 1970-12-30 DOA: 10/19/2019 PCP: [facility]     Brief Narrative:   Greg Mcgee is an 49 y.o. male past medical history significant for uncontrolled diabetes mellitus, chronic renal disease, essential hypertension presents to the ED from the PCP office with worsening renal function, the patient has been experiencing fatigue and malaise since 10/09/2019 tested positive for SARS-CoV-2 on 10/12/2019 went on to develop diarrhea and loss of appetite, with a mild productive cough but not really short of breath.  Labwork from the PCP show worsening renal function and was referred to the ED.  Assessment/Plan:   Acute renal failure superimposed on stage 2 chronic kidney disease (HCC) In the setting of diarrhea due to COVID-19, hydrochlorothiazide ACE inhibitor and hyperglycemia. With a baseline creatinine of 1.2 likely prerenal azotemia started on fluid resuscitation, nephrotoxic medications were stopped. Renal function is slowly improving, continue strict I's and O's, daily weights renal ultrasound showed renal disease no obstruction or mass.  Uncontrolled diabetes mellitus type 2 hyperglycemia/honk: With an A1c of 12.3 with a blood glucose of 400 he was started on insulin drip Metformin was discontinued. He has now been transitioned to long-acting insulin plus sliding scale blood glucose this morning 160.  Pneumonia due to COVID-19/gastroenteritis due to COVID-19 With a SARS-CoV-2 PCR positive on 10/12/2019, he was having severe diarrhea which is now resolved. Inflammatory markers are slightly elevated. Chest x-ray reviewed show mild bilateral infiltrates. Continue IV remdesivir check saturations with ambulation.   Essential hypertension Continue Norvasc hold lisinopril and HCTZ.  Leukocytosis: With a mild leukocytosis and a left shift, likely reactive now resolved has remained afebrile procalcitonin  is less than 0.1.  DVT prophylaxis: lovenox Family Communication:none Disposition Plan/Barrier to D/C: Patient can probably be discharged tomorrow if renal function continues to improve.  Code Status:     Code Status Orders  (From admission, onward)         Start     Ordered   10/20/19 0804  Full code  Continuous     10/20/19 0803        Code Status History    This patient has a current code status but no historical code status.   Advance Care Planning Activity    Advance Directive Documentation     Most Recent Value  Type of Advance Directive  Living will  Pre-existing out of facility DNR order (yellow form or pink MOST form)  --  "MOST" Form in Place?  --        IV Access:    Peripheral IV   Procedures and diagnostic studies:   US RENAL  Result Date: 10/20/2019 CLINICAL DATA:  Acute renal failure superimposed upon stage II chronic renal disease, hypertension, type II diabetes mellitus, history COVID-19 EXAM: RENAL / URINARY TRACT ULTRASOUND COMPLETE COMPARISON:  CT abdomen 01/19/2007 FINDINGS: Right Kidney: Renal measurements: 10.4 x 5.0 x 6.3 cm = volume: 170 mL. Mild cortical thinning with increased cortical echogenicity. No mass, hydronephrosis or shadowing calcification. Left Kidney: Renal measurements: 12.2 x 5.6 x 5.5 cm = volume: 195 mL. Minimal cortical thinning. Increased cortical echogenicity. No mass, hydronephrosis, or shadowing calcification. Bladder: Decompressed, unable to evaluate Other: Incidentally noted increased hepatic echogenicity suggesting fatty infiltration. IMPRESSION: Medical renal disease changes of both kidneys without mass or hydronephrosis. Probable fatty infiltration of liver. Electronically Signed   By: Ulyses Southward M.D.   On: 10/20/2019 09:18   DG CHEST  PORT 1 VIEW  Result Date: 10/20/2019 CLINICAL DATA:  COVID-19 with cough EXAM: PORTABLE CHEST 1 VIEW COMPARISON:  02/24/2018 FINDINGS: Subtle patchy subpleural opacity in the left  lower lung. No edema or effusion. Normal heart size and mediastinal contours. IMPRESSION: Suspect early infiltrate in the left lung. Electronically Signed   By: Monte Fantasia M.D.   On: 10/20/2019 07:33     Medical Consultants:    None.  Anti-Infectives:   IV remdesivir  Subjective:    Greg Mcgee diarrhea has improved, did not feel short of breath with ambulation he relates his appetite has returned feels a lot better than yesterday.  Objective:    Vitals:   10/20/19 1649 10/20/19 2010 10/21/19 0051 10/21/19 0529  BP: 129/88 133/79 123/87 127/84  Pulse: 89 97 87   Resp: 16 19 20    Temp: 99.1 F (37.3 C) 98.3 F (36.8 C) 98.5 F (36.9 C)   TempSrc: Oral Oral Oral   SpO2: 96% 95% 96%   Weight:      Height:       SpO2: 96 %   Intake/Output Summary (Last 24 hours) at 10/21/2019 9563 Last data filed at 10/20/2019 1900 Gross per 24 hour  Intake 1098.31 ml  Output --  Net 1098.31 ml   Filed Weights   10/19/19 2017  Weight: 86.2 kg    Exam: General exam: In no acute distress. Respiratory system: Good air movement and clear to auscultation. Cardiovascular system: S1 & S2 heard, RRR. No JVD. Gastrointestinal system: Abdomen is nondistended, soft and nontender.  Extremities: No pedal edema. Skin: No rashes, lesions or ulcers Psychiatry: Judgement and insight appear normal. Mood & affect appropriate.  Data Reviewed:    Labs: Basic Metabolic Panel: Recent Labs  Lab 10/20/19 0832 10/20/19 0832 10/20/19 1235 10/20/19 1235 10/20/19 1615 10/20/19 1615 10/20/19 2104 10/21/19 0035  NA 134*  --  140  --  136  --  137 139  K 3.7   < > 3.4*   < > 3.8   < > 3.6 3.4*  CL 96*  --  102  --  101  --  102 103  CO2 25  --  27  --  26  --  26 26  GLUCOSE 410*  --  191*  --  290*  --  338* 163*  BUN 47*  --  43*  --  44*  --  48* 43*  CREATININE 2.65*  --  2.25*  --  2.11*  --  2.19* 1.89*  CALCIUM 7.7*  --  7.6*  --  7.7*  --  7.7* 7.9*   < > = values in this  interval not displayed.   GFR Estimated Creatinine Clearance: 51.3 mL/min (A) (by C-G formula based on SCr of 1.89 mg/dL (H)). Liver Function Tests: Recent Labs  Lab 10/19/19 2033  AST 20  ALT 18  ALKPHOS 63  BILITOT 1.2  PROT 6.6  ALBUMIN 2.2*   No results for input(s): LIPASE, AMYLASE in the last 168 hours. No results for input(s): AMMONIA in the last 168 hours. Coagulation profile No results for input(s): INR, PROTIME in the last 168 hours. COVID-19 Labs  Recent Labs    10/20/19 0832 10/21/19 0035  DDIMER 0.43 0.37  FERRITIN 424*  --   LDH 273*  --   CRP  --  11.2*    No results found for: SARSCOV2NAA  CBC: Recent Labs  Lab 10/19/19 2033 10/21/19 0035  WBC 12.5* 9.0  NEUTROABS 10.1* 6.0  HGB 14.7 13.1  HCT 41.4 37.6*  MCV 80.4 80.9  PLT 402* 422*   Cardiac Enzymes: No results for input(s): CKTOTAL, CKMB, CKMBINDEX, TROPONINI in the last 168 hours. BNP (last 3 results) No results for input(s): PROBNP in the last 8760 hours. CBG: Recent Labs  Lab 10/20/19 1214 10/20/19 1313 10/20/19 1347 10/20/19 1647 10/20/19 2054  GLUCAP 200* 192* 214* 276* 352*   D-Dimer: Recent Labs    10/20/19 0832 10/21/19 0035  DDIMER 0.43 0.37   Hgb A1c: Recent Labs    10/20/19 0832  HGBA1C 12.3*   Lipid Profile: No results for input(s): CHOL, HDL, LDLCALC, TRIG, CHOLHDL, LDLDIRECT in the last 72 hours. Thyroid function studies: No results for input(s): TSH, T4TOTAL, T3FREE, THYROIDAB in the last 72 hours.  Invalid input(s): FREET3 Anemia work up: Recent Labs    10/20/19 0832  FERRITIN 424*   Sepsis Labs: Recent Labs  Lab 10/19/19 2033 10/20/19 0832 10/21/19 0035  PROCALCITON  --  <0.10  --   WBC 12.5*  --  9.0   Microbiology No results found for this or any previous visit (from the past 240 hour(s)).   Medications:   . amLODipine  5 mg Oral Daily  . atorvastatin  10 mg Oral q1800  . enoxaparin (LOVENOX) injection  40 mg Subcutaneous Daily   . insulin aspart  0-15 Units Subcutaneous TID WC  . insulin aspart  0-5 Units Subcutaneous QHS  . insulin aspart  4 Units Subcutaneous TID WC  . insulin glargine  20 Units Subcutaneous BID   Continuous Infusions: . sodium chloride Stopped (10/20/19 1818)  . dextrose 10 mL/hr at 10/20/19 1423  . insulin Stopped (10/20/19 1422)  . remdesivir 100 mg in NS 100 mL        LOS: 1 day   Marinda Elk  [facility]  10/21/2019, 7:12 AM

## 2019-10-22 DIAGNOSIS — I10 Essential (primary) hypertension: Secondary | ICD-10-CM

## 2019-10-22 LAB — BASIC METABOLIC PANEL
Anion gap: 8 (ref 5–15)
BUN: 34 mg/dL — ABNORMAL HIGH (ref 6–20)
CO2: 27 mmol/L (ref 22–32)
Calcium: 8 mg/dL — ABNORMAL LOW (ref 8.9–10.3)
Chloride: 108 mmol/L (ref 98–111)
Creatinine, Ser: 1.7 mg/dL — ABNORMAL HIGH (ref 0.61–1.24)
GFR calc Af Amer: 55 mL/min — ABNORMAL LOW (ref >60)
GFR calc non Af Amer: 47 mL/min — ABNORMAL LOW (ref >60)
Glucose, Bld: 103 mg/dL — ABNORMAL HIGH (ref 70–99)
Potassium: 3.2 mmol/L — ABNORMAL LOW (ref 3.5–5.1)
Sodium: 143 mmol/L (ref 135–145)

## 2019-10-22 LAB — CBC WITH DIFFERENTIAL/PLATELET
Abs Immature Granulocytes: 0.07 10*3/uL (ref 0.00–0.07)
Basophils Absolute: 0 10*3/uL (ref 0.0–0.1)
Basophils Relative: 0 %
Eosinophils Absolute: 0.1 10*3/uL (ref 0.0–0.5)
Eosinophils Relative: 1 %
HCT: 35.7 % — ABNORMAL LOW (ref 39.0–52.0)
Hemoglobin: 12.6 g/dL — ABNORMAL LOW (ref 13.0–17.0)
Immature Granulocytes: 1 %
Lymphocytes Relative: 17 %
Lymphs Abs: 1.4 10*3/uL (ref 0.7–4.0)
MCH: 28.4 pg (ref 26.0–34.0)
MCHC: 35.3 g/dL (ref 30.0–36.0)
MCV: 80.6 fL (ref 80.0–100.0)
Monocytes Absolute: 0.7 10*3/uL (ref 0.1–1.0)
Monocytes Relative: 8 %
Neutro Abs: 6.2 10*3/uL (ref 1.7–7.7)
Neutrophils Relative %: 73 %
Platelets: 449 10*3/uL — ABNORMAL HIGH (ref 150–400)
RBC: 4.43 MIL/uL (ref 4.22–5.81)
RDW: 11.3 % — ABNORMAL LOW (ref 11.5–15.5)
WBC: 8.4 10*3/uL (ref 4.0–10.5)
nRBC: 0 % (ref 0.0–0.2)

## 2019-10-22 LAB — GLUCOSE, CAPILLARY
Glucose-Capillary: 135 mg/dL — ABNORMAL HIGH (ref 70–99)
Glucose-Capillary: 116 mg/dL — ABNORMAL HIGH (ref 70–99)
Glucose-Capillary: 164 mg/dL — ABNORMAL HIGH (ref 70–99)

## 2019-10-22 LAB — C-REACTIVE PROTEIN: CRP: 6 mg/dL — ABNORMAL HIGH (ref <1.0)

## 2019-10-22 LAB — D-DIMER, QUANTITATIVE: D-Dimer, Quant: 0.41 ug/mL-FEU (ref 0.00–0.50)

## 2019-10-22 MED ORDER — INSULIN ASPART PROT & ASPART (70-30 MIX) 100 UNIT/ML ~~LOC~~ SUSP: 18.0000 [IU] | Freq: Two times a day (BID) | SUBCUTANEOUS | 11 refills | Status: AC

## 2019-10-22 NOTE — Progress Notes (Signed)
Placed Libre Sensor to pts left arm. Pt given remote and educated on how to use devices. Pt will f/u with PCP or endicrinologist after d/c. Pt verbalized understanding.

## 2019-10-22 NOTE — Discharge Summary (Signed)
Physician Discharge Summary  Greg Mcgee WEX:[id number] DOB: 10-09-1970 DOA: 10/19/2019  PCP: [facility]  Admit date: 10/19/2019 Discharge date: 10/22/2019  Admitted From: Home Disposition:  Home  Recommendations for Outpatient Follow-up:  1. Follow up with PCP in 1-2 weeks   Home Health:No Equipment/Devices:None  Discharge Condition:Stable CODE STATUS:Full Diet recommendation: Heart Healthy  Brief/Interim Summary: 49 y.o. male past medical history significant for uncontrolled diabetes mellitus, chronic renal disease, essential hypertension presents to the ED from the PCP office with worsening renal function, the patient has been experiencing fatigue and malaise since 10/09/2019 tested positive for SARS-CoV-2 on 10/12/2019 went on to develop diarrhea and loss of appetite, with a mild productive cough but not really short of breath.    Discharge Diagnoses:  AKI (acute kidney injury) ([place]) on chronic kidney disease stage III: In the setting of acute diarrhea due to COVID-19 hydrochlorothiazide and ACE inhibitor accompanied by severe hyperglycemia. With a baseline creatinine of 1.2 likely prerenal azotemia started on IV fluid hydration nephrotoxic medications were stopped he was fluid resuscitated and his creatinine returned to baseline.  Uncontrolled diabetes mellitus type 2/hyperosmolar hyperglycemic nonketotic state: With an A1c of 12 blood glucose 400 on admission he was started on IV insulin drip his blood glucose was controlled and transitioned to long-acting insulin where his blood glucose remain 108. 200. 7030 was increased for home which she will continue as an outpatient.  Gastroenteritis due to COVID-19: SARS-CoV-2 PCR was positive on 10/31/2019 he was having severe diarrhea he was fluid resuscitated started on remdesivir chest x-ray showed bilateral infiltrates his diarrhea resolved. You continue his remdesivir as an outpatient.  Essential hypertension: No changes  were made   Leukocytosis: Likely reactive he remained afebrile procalcitonin less than 0.1.   Discharge Instructions  Discharge Instructions    Diet - low sodium heart healthy   Complete by: As directed    Increase activity slowly   Complete by: As directed      Allergies as of 10/22/2019   No Known Allergies     Medication List    TAKE these medications   amLODipine 5 MG tablet Commonly known as: NORVASC Take 5 mg by mouth daily.   atorvastatin 10 MG tablet Commonly known as: LIPITOR Take 10 mg by mouth daily at 6 PM.   insulin aspart protamine- aspart (70-30) 100 UNIT/ML injection Commonly known as: NOVOLOG MIX 70/30 Inject 0.18 mLs (18 Units total) into the skin 2 (two) times daily with a meal. What changed: how much to take   lisinopril-hydrochlorothiazide 20-25 MG tablet Commonly known as: ZESTORETIC Take 1 tablet by mouth daily.   metFORMIN 500 MG tablet Commonly known as: GLUCOPHAGE Take 2,000 mg by mouth daily with breakfast.      Follow-up Information    [facility] Follow up.   Specialty: Internal Medicine Why: 10:00 AM on Saturday 2/13 and Sunday 2/14 Contact information: [place] [address]         No Known Allergies  Consultations: None  Procedures/Studies: US RENAL  Result Date: 10/20/2019 CLINICAL DATA:  Acute renal failure superimposed upon stage II chronic renal disease, hypertension, type II diabetes mellitus, history COVID-19 EXAM: RENAL / URINARY TRACT ULTRASOUND COMPLETE COMPARISON:  CT abdomen 01/19/2007 FINDINGS: Right Kidney: Renal measurements: 10.4 x 5.0 x 6.3 cm = volume: 170 mL. Mild cortical thinning with increased cortical echogenicity. No mass, hydronephrosis or shadowing calcification. Left Kidney: Renal measurements: 12.2 x 5.6 x 5.5 cm =  volume: 195 mL. Minimal cortical thinning. Increased cortical echogenicity. No mass, hydronephrosis, or shadowing  calcification. Bladder: Decompressed, unable to evaluate Other: Incidentally noted increased hepatic echogenicity suggesting fatty infiltration. IMPRESSION: Medical renal disease changes of both kidneys without mass or hydronephrosis. Probable fatty infiltration of liver. Electronically Signed   By: Ulyses Southward M.D.   On: 10/20/2019 09:18   DG CHEST PORT 1 VIEW  Result Date: 10/20/2019 CLINICAL DATA:  COVID-19 with cough EXAM: PORTABLE CHEST 1 VIEW COMPARISON:  02/24/2018 FINDINGS: Subtle patchy subpleural opacity in the left lower lung. No edema or effusion. Normal heart size and mediastinal contours. IMPRESSION: Suspect early infiltrate in the left lung. Electronically Signed   By: Marnee Spring M.D.   On: 10/20/2019 07:33     Subjective: No complaints.  Discharge Exam: Vitals:   10/22/19 0047 10/22/19 0400  BP: 127/87 136/86  Pulse: 86 80  Resp: 14 14  Temp: 98.2 F (36.8 C) 98.6 F (37 C)  SpO2: 96% 96%   Vitals:   10/21/19 1639 10/21/19 1956 10/22/19 0047 10/22/19 0400  BP: 140/86 138/90 127/87 136/86  Pulse: 93 87 86 80  Resp: 15 16 14 14   Temp: 98 F (36.7 C) 98.6 F (37 C) 98.2 F (36.8 C) 98.6 F (37 C)  TempSrc: Oral Oral  Oral  SpO2: 98% 97% 96% 96%  Weight:      Height:        General: Pt is alert, awake, not in acute distress Cardiovascular: RRR, S1/S2 +, no rubs, no gallops Respiratory: CTA bilaterally, no wheezing, no rhonchi Abdominal: Soft, NT, ND, bowel sounds + Extremities: no edema, no cyanosis    The results of significant diagnostics from this hospitalization (including imaging, microbiology, ancillary and laboratory) are listed below for reference.     Microbiology: No results found for this or any previous visit (from the past 240 hour(s)).   Labs: BNP (last 3 results) No results for input(s): BNP in the last 8760 hours. Basic Metabolic Panel: Recent Labs  Lab 10/20/19 1235 10/20/19 1615 10/20/19 2104 10/21/19 0035 10/22/19 0224   NA 140 136 137 139 143  K 3.4* 3.8 3.6 3.4* 3.2*  CL 102 101 102 103 108  CO2 27 26 26 26 27   GLUCOSE 191* 290* 338* 163* 103*  BUN 43* 44* 48* 43* 34*  CREATININE 2.25* 2.11* 2.19* 1.89* 1.70*  CALCIUM 7.6* 7.7* 7.7* 7.9* 8.0*   Liver Function Tests: Recent Labs  Lab 10/19/19 2033  AST 20  ALT 18  ALKPHOS 63  BILITOT 1.2  PROT 6.6  ALBUMIN 2.2*   No results for input(s): LIPASE, AMYLASE in the last 168 hours. No results for input(s): AMMONIA in the last 168 hours. CBC: Recent Labs  Lab 10/19/19 2033 10/21/19 0035 10/22/19 0224  WBC 12.5* 9.0 8.4  NEUTROABS 10.1* 6.0 6.2  HGB 14.7 13.1 12.6*  HCT 41.4 37.6* 35.7*  MCV 80.4 80.9 80.6  PLT 402* 422* 449*   Cardiac Enzymes: No results for input(s): CKTOTAL, CKMB, CKMBINDEX, TROPONINI in the last 168 hours. BNP: Invalid input(s): POCBNP CBG: Recent Labs  Lab 10/20/19 1647 10/20/19 2054 10/21/19 0748 10/21/19 1134 10/21/19 1958  GLUCAP 276* 352* 200* 216* 213*   D-Dimer Recent Labs    10/21/19 0035 10/22/19 0224  DDIMER 0.37 0.41   Hgb A1c Recent Labs    10/20/19 0832  HGBA1C 12.3*   Lipid Profile No results for input(s): CHOL, HDL, LDLCALC, TRIG, CHOLHDL, LDLDIRECT in the last 72 hours.  Thyroid function studies No results for input(s): TSH, T4TOTAL, T3FREE, THYROIDAB in the last 72 hours.  Invalid input(s): FREET3 Anemia work up Recent Labs    10/20/19 0832  FERRITIN 424*   Urinalysis    Component Value Date/Time   COLORURINE YELLOW 10/20/2019 1017   APPEARANCEUR HAZY (A) 10/20/2019 1017   LABSPEC 1.014 10/20/2019 1017   PHURINE 5.0 10/20/2019 1017   GLUCOSEU >=500 (A) 10/20/2019 1017   HGBUR SMALL (A) 10/20/2019 1017   BILIRUBINUR NEGATIVE 10/20/2019 1017   KETONESUR 5 (A) 10/20/2019 1017   PROTEINUR 100 (A) 10/20/2019 1017   UROBILINOGEN 0.2 02/24/2018 1056   NITRITE NEGATIVE 10/20/2019 1017   LEUKOCYTESUR NEGATIVE 10/20/2019 1017   Sepsis Labs Invalid input(s): PROCALCITONIN,   WBC,  LACTICIDVEN Microbiology No results found for this or any previous visit (from the past 240 hour(s)).   Time coordinating discharge: Over 30 minutes  SIGNED:   Marinda Elk, MD  [facility] 10/22/2019, 6:56 AM Pager   If 7PM-7AM, please contact night-coverage [email or web address] Password [email or web address]

## 2019-10-22 NOTE — Progress Notes (Signed)
Pt given discharge instructions and discharge instructions explained to pt. Pt has Libre set up and meter is working.Pt advised to set up appt with his Endicrinologist upon d/c. Pt discharged home with family. Pt will contact PCP. IVs removed and site covered with guaze and tape.  Pt has no complaints at this time.

## 2019-10-22 NOTE — Progress Notes (Signed)
Inpatient Diabetes Program Recommendations  AACE/ADA: New Consensus Statement on Inpatient Glycemic Control (2015)  Target Ranges:  Prepandial:   less than 140 mg/dL      Peak postprandial:   less than 180 mg/dL (1-2 hours)      Critically ill patients:  140 - 180 mg/dL   Lab Results  Component Value Date   GLUCAP 116 (H) 10/22/2019   HGBA1C 12.3 (H) 10/20/2019    Spoke with patient again to review material from yesterday as well as discharge insulin doses. Stressed the importance of following up with PCP, when to call Md and survival skills.   Contacted RN Pharmacologist. RN to reach out when sensor applied.   Patient has no further questions at this time.   Thanks, Lujean Rave, MSN, RNC-OB Diabetes Coordinator [phone] (8a-5p)

## 2019-10-23 ENCOUNTER — Encounter (HOSPITAL_COMMUNITY): Payer: Self-pay

## 2019-10-23 ENCOUNTER — Ambulatory Visit (HOSPITAL_COMMUNITY)
Admission: RE | Admit: 2019-10-23 | Discharge: 2019-10-23 | Disposition: A | Discharge status: Home / Self Care | Payer: 59 | Source: Ambulatory Visit | Attending: Pulmonary Disease | Admitting: Pulmonary Disease

## 2019-10-23 DIAGNOSIS — U071 COVID-19: Secondary | ICD-10-CM | POA: Insufficient documentation

## 2019-10-23 DIAGNOSIS — J1289 Other viral pneumonia: Secondary | ICD-10-CM | POA: Diagnosis not present

## 2019-10-23 MED ORDER — SODIUM CHLORIDE 0.9 % IV SOLN
100.0000 mg | Freq: Once | INTRAVENOUS | Status: AC
  Administered 2019-10-23: 10:00:00 100 mg via INTRAVENOUS
  Filled 2019-10-23: qty 20

## 2019-10-23 MED ORDER — SODIUM CHLORIDE 0.9 % IV SOLN: INTRAVENOUS | Status: DC | PRN

## 2019-10-23 MED ORDER — EPINEPHRINE 0.3 MG/0.3ML IJ SOAJ: 0.3000 mg | Freq: Once | INTRAMUSCULAR | Status: DC | PRN

## 2019-10-23 MED ORDER — ALBUTEROL SULFATE HFA 108 (90 BASE) MCG/ACT IN AERS: 2.0000 | INHALATION_SPRAY | Freq: Once | RESPIRATORY_TRACT | Status: DC | PRN

## 2019-10-23 MED ORDER — DIPHENHYDRAMINE HCL 50 MG/ML IJ SOLN: 50.0000 mg | Freq: Once | INTRAMUSCULAR | Status: DC | PRN

## 2019-10-23 MED ORDER — FAMOTIDINE IN NACL 20-0.9 MG/50ML-% IV SOLN: 20.0000 mg | Freq: Once | INTRAVENOUS | Status: DC | PRN

## 2019-10-23 MED ORDER — METHYLPREDNISOLONE SODIUM SUCC 125 MG IJ SOLR: 125.0000 mg | Freq: Once | INTRAMUSCULAR | Status: DC | PRN

## 2019-10-23 NOTE — Progress Notes (Signed)
  Diagnosis: COVID-19  Physician: Dr. Shan Levans  Procedure: [facility] Med: remdesivir infusion.  Complications: No immediate complications noted.  Discharge: Discharged home   Shon Baton 10/23/2019

## 2019-10-23 NOTE — Discharge Instructions (Signed)

## 2019-10-24 ENCOUNTER — Encounter (HOSPITAL_COMMUNITY): Payer: Self-pay

## 2019-10-24 ENCOUNTER — Ambulatory Visit (HOSPITAL_COMMUNITY)
Admit: 2019-10-24 | Discharge: 2019-10-24 | Disposition: A | Discharge status: Home / Self Care | Payer: 59 | Attending: Pulmonary Disease | Admitting: Pulmonary Disease

## 2019-10-24 DIAGNOSIS — U071 COVID-19: Secondary | ICD-10-CM | POA: Diagnosis not present

## 2019-10-24 MED ORDER — EPINEPHRINE 0.3 MG/0.3ML IJ SOAJ: 0.3000 mg | Freq: Once | INTRAMUSCULAR | Status: DC | PRN

## 2019-10-24 MED ORDER — FAMOTIDINE IN NACL 20-0.9 MG/50ML-% IV SOLN: 20.0000 mg | Freq: Once | INTRAVENOUS | Status: DC | PRN

## 2019-10-24 MED ORDER — SODIUM CHLORIDE 0.9 % IV SOLN: INTRAVENOUS | Status: DC | PRN

## 2019-10-24 MED ORDER — DIPHENHYDRAMINE HCL 50 MG/ML IJ SOLN: 50.0000 mg | Freq: Once | INTRAMUSCULAR | Status: DC | PRN

## 2019-10-24 MED ORDER — METHYLPREDNISOLONE SODIUM SUCC 125 MG IJ SOLR: 125.0000 mg | Freq: Once | INTRAMUSCULAR | Status: DC | PRN

## 2019-10-24 MED ORDER — SODIUM CHLORIDE 0.9 % IV SOLN
100.0000 mg | Freq: Once | INTRAVENOUS | Status: AC
  Administered 2019-10-24: 10:00:00 100 mg via INTRAVENOUS
  Filled 2019-10-24: qty 20

## 2019-10-24 MED ORDER — ALBUTEROL SULFATE HFA 108 (90 BASE) MCG/ACT IN AERS: 2.0000 | INHALATION_SPRAY | Freq: Once | RESPIRATORY_TRACT | Status: DC | PRN

## 2019-10-24 NOTE — Discharge Instructions (Signed)
10 Things You Can Do to Manage Your COVID-19 Symptoms at Home If you have possible or confirmed COVID-19: 1. Stay home from work and school. And stay away from other public places. If you must go out, avoid using any kind of public transportation, ridesharing, or taxis. 2. Monitor your symptoms carefully. If your symptoms get worse, call your healthcare provider immediately. 3. Get rest and stay hydrated. 4. If you have a medical appointment, call the healthcare provider ahead of time and tell them that you have or may have COVID-19. 5. For medical emergencies, call 911 and notify the dispatch personnel that you have or may have COVID-19. 6. Cover your cough and sneezes with a tissue or use the inside of your elbow. 7. Wash your hands often with soap and water for at least 20 seconds or clean your hands with an alcohol-based hand sanitizer that contains at least 60% alcohol. 8. As much as possible, stay in a specific room and away from other people in your home. Also, you should use a separate bathroom, if available. If you need to be around other people in or outside of the home, wear a mask. 9. Avoid sharing personal items with other people in your household, like dishes, towels, and bedding. 10. Clean all surfaces that are touched often, like counters, tabletops, and doorknobs. Use household cleaning sprays or wipes according to the label instructions. cdc.gov/coronavirus 03/10/2019 This information is not intended to replace advice given to you by your health care provider. Make sure you discuss any questions you have with your health care provider. Document Revised: 08/12/2019 Document Reviewed: 08/12/2019 Elsevier Patient Education  2020 [employer]  

## 2019-10-24 NOTE — Progress Notes (Signed)
  Diagnosis: COVID-19  Physician: Dr. Delford Field  Procedure: [facility] Med: remdesivir infusion.  Complications: No immediate complications noted.  Discharge: Discharged home   Greg Mcgee 10/24/2019

## 2020-03-20 IMAGING — US US RENAL
1 series · 14 of 25 positions shown · non-contrast
Comparison: CT abdomen 01/19/2007

CLINICAL DATA: Acute renal failure superimposed upon stage II
chronic renal disease, hypertension, type II diabetes mellitus,
history [id number]

EXAM:
RENAL / URINARY TRACT ULTRASOUND COMPLETE

[Series 1: us renal · 0.25mm/px · 14 of 32 slices shown]
[im 1/32]
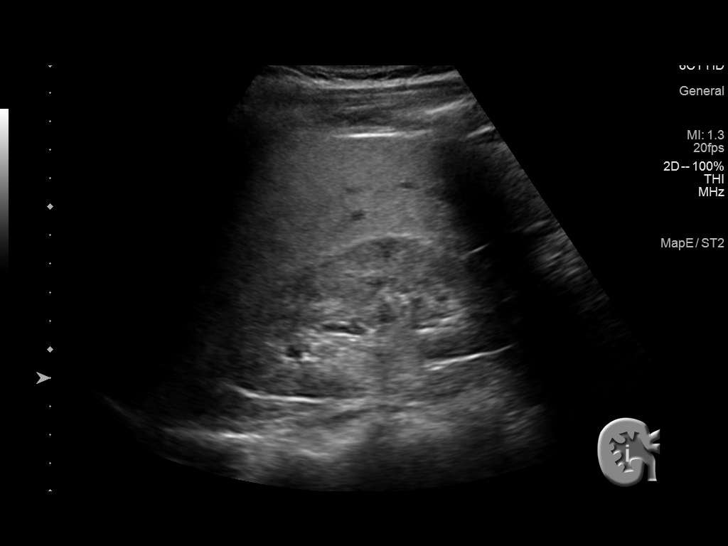
[im 3/32]
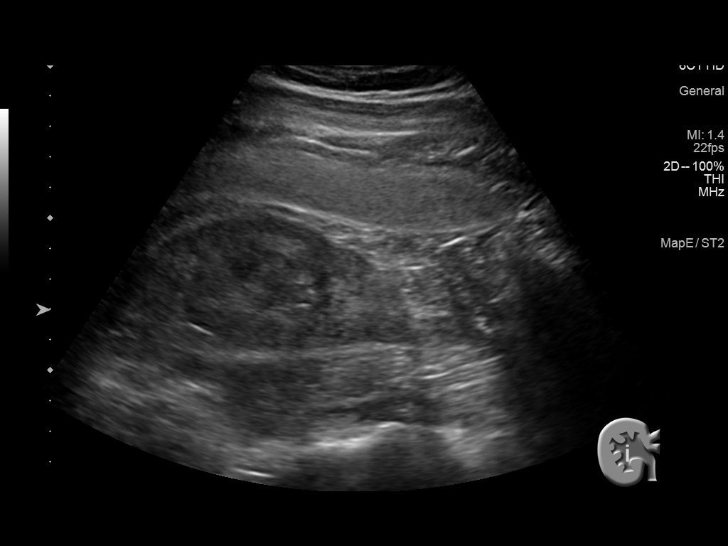
[im 6/32]
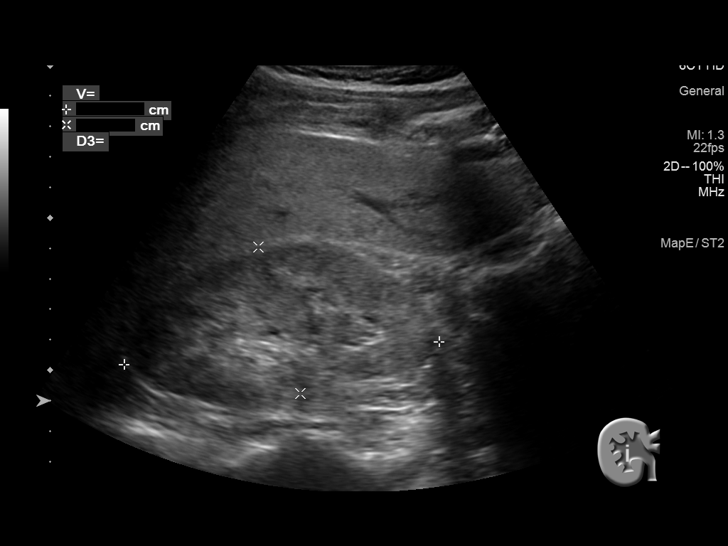
[im 8/32]
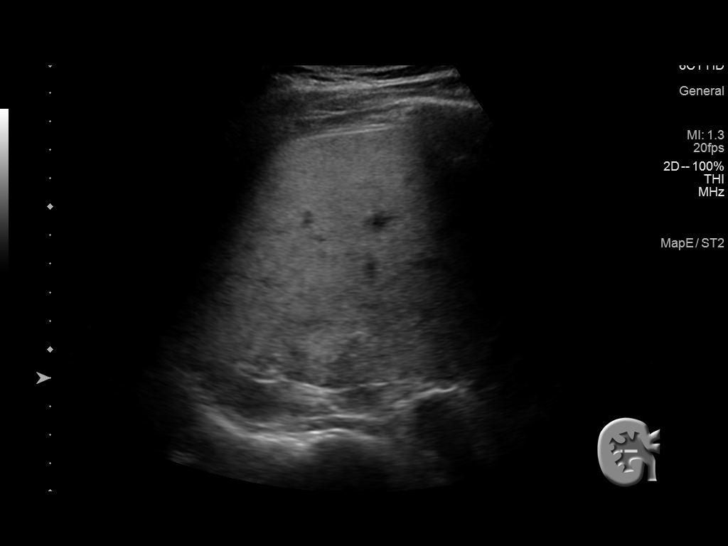
[im 11/32]
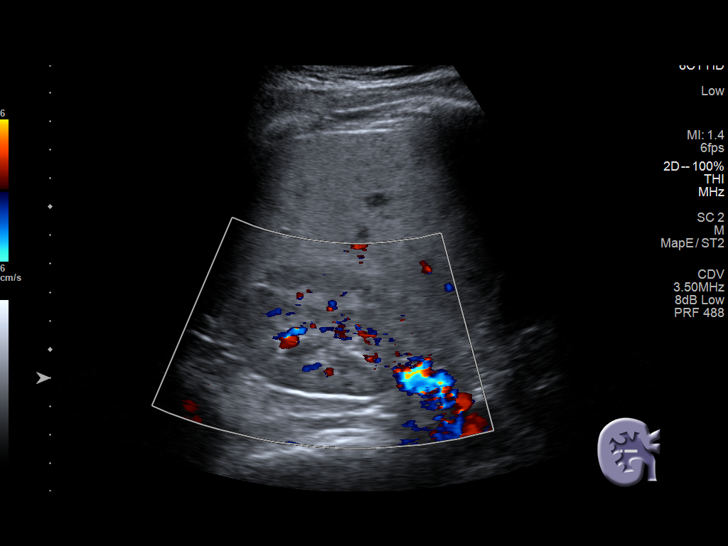
[im 12/32]
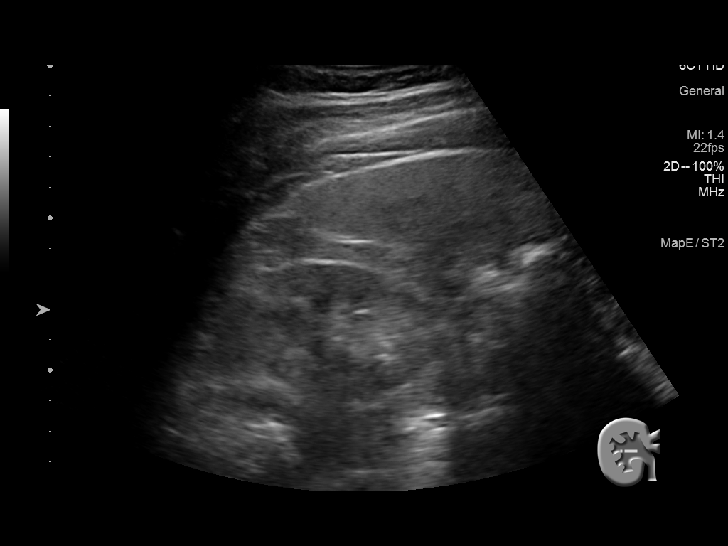
[im 15/32]
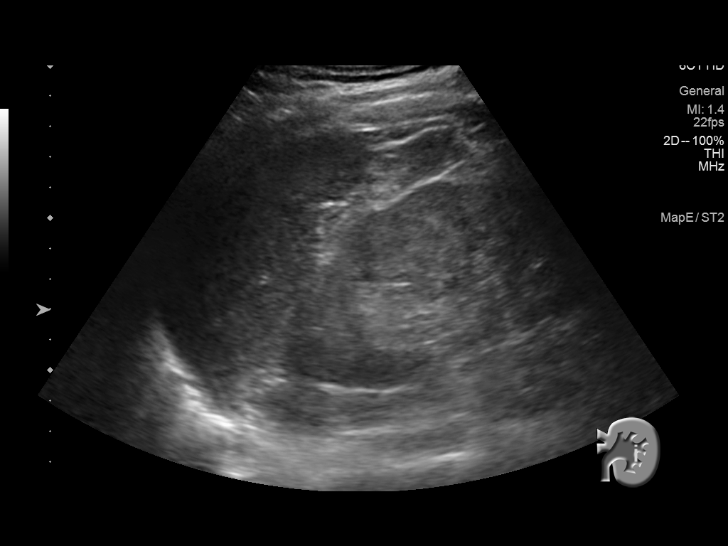
[im 17/32]
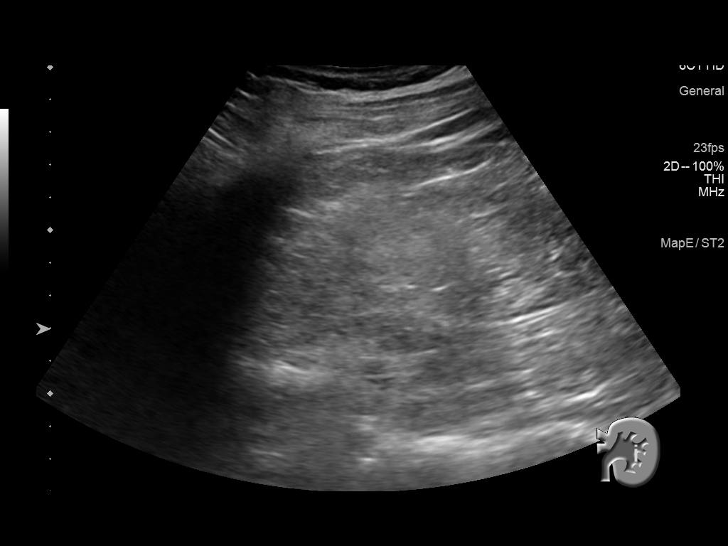
[im 20/32]
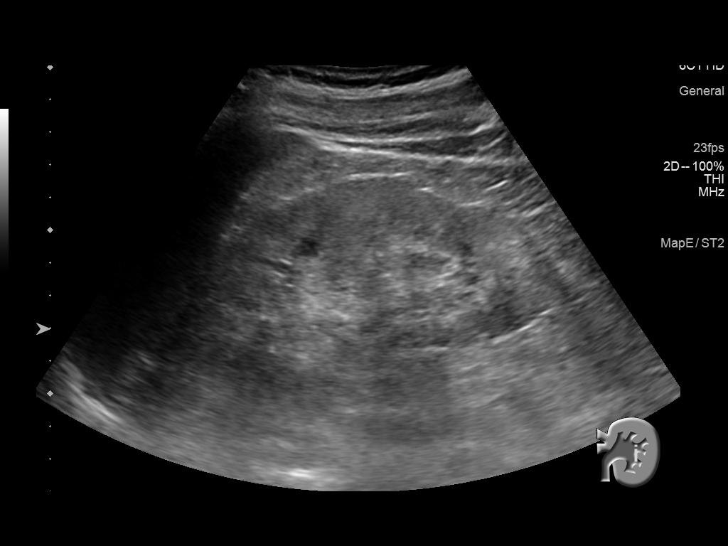
[im 21/32]
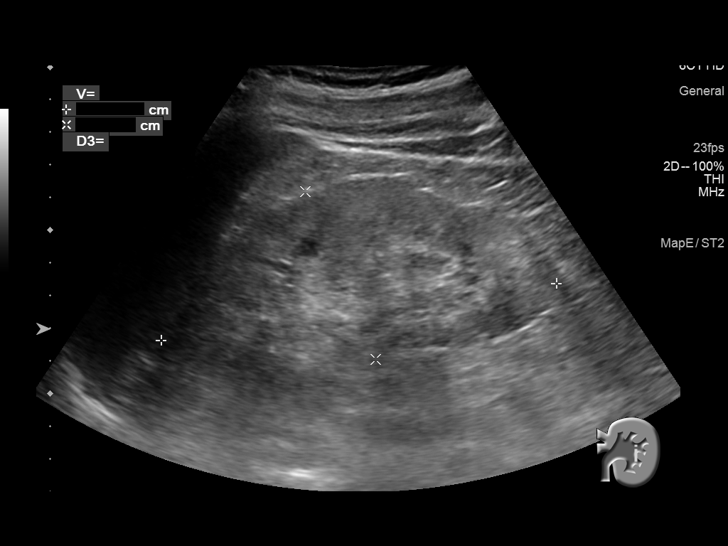
[im 24/32]
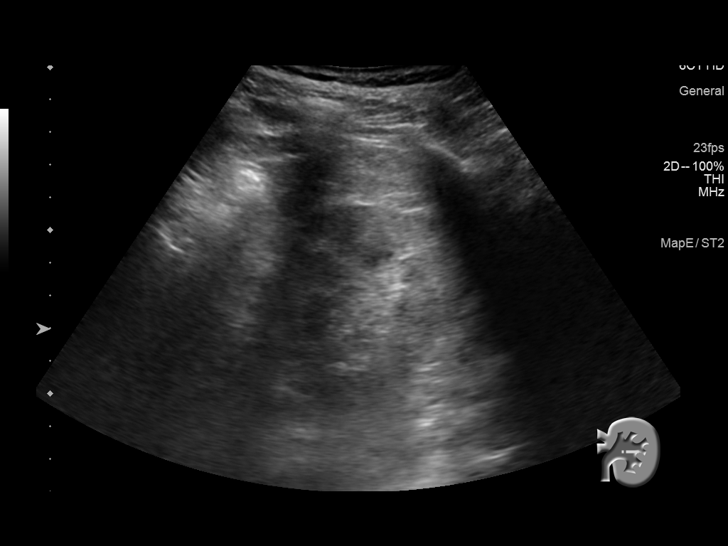
[im 26/32]
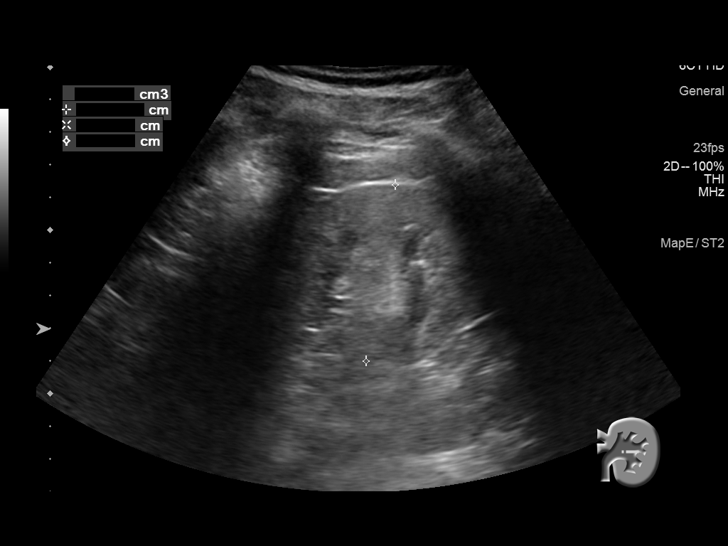
[im 29/32]
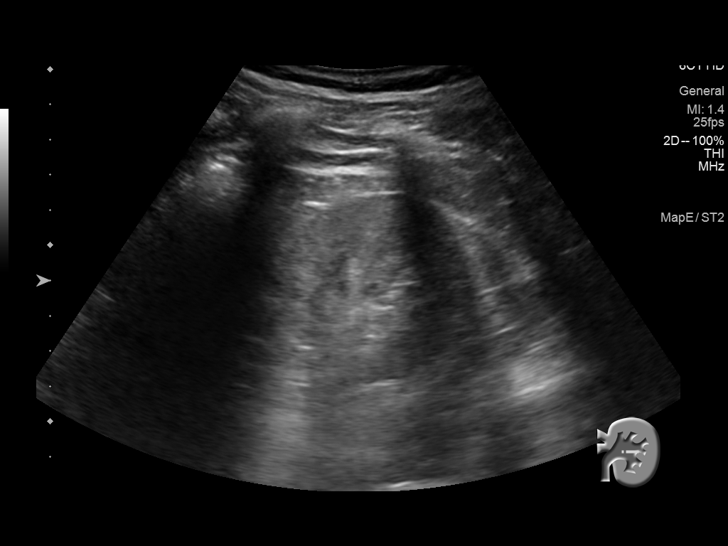
[im 32/32]
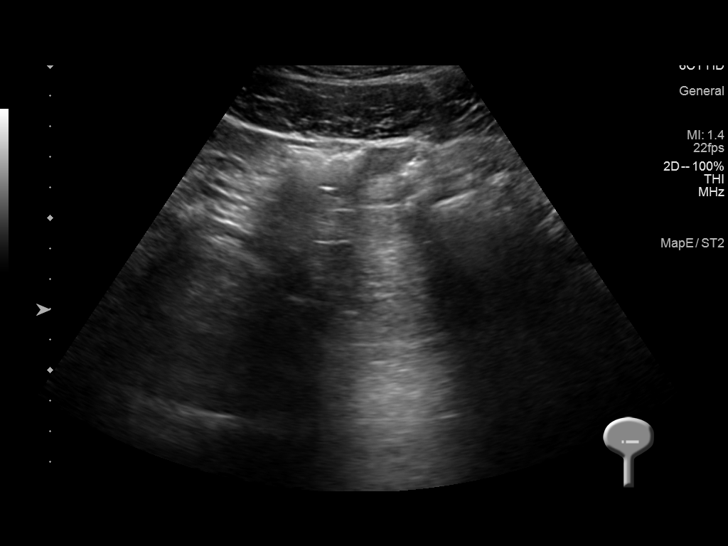

[14 of 25 positions shown; findings below may reference images not displayed]

FINDINGS: Right Kidney:

Renal measurements: 10.4 x 5.0 x 6.3 cm = volume: 170 mL. Mild
cortical thinning with increased cortical echogenicity. No mass,
hydronephrosis or shadowing calcification.

Left Kidney:

Renal measurements: 12.2 x 5.6 x 5.5 cm = volume: 195 mL. Minimal
cortical thinning. Increased cortical echogenicity. No mass,
hydronephrosis, or shadowing calcification.

Bladder:

Decompressed, unable to evaluate

Other:

Incidentally noted increased hepatic echogenicity suggesting fatty
infiltration.
IMPRESSION: Medical renal disease changes of both kidneys without mass or
hydronephrosis.

Probable fatty infiltration of liver.

## 2022-10-04 ENCOUNTER — Other Ambulatory Visit: Payer: Self-pay

## 2022-10-04 ENCOUNTER — Emergency Department (HOSPITAL_COMMUNITY): Payer: No Typology Code available for payment source

## 2022-10-04 ENCOUNTER — Emergency Department (HOSPITAL_COMMUNITY)
Admission: EM | Admit: 2022-10-04 | Discharge: 2022-10-04 | Disposition: A | Discharge status: Home / Self Care | Payer: No Typology Code available for payment source | Attending: Student | Admitting: Student

## 2022-10-04 ENCOUNTER — Telehealth (HOSPITAL_COMMUNITY): Payer: Self-pay | Admitting: Student

## 2022-10-04 DIAGNOSIS — R000 Tachycardia, unspecified: Secondary | ICD-10-CM | POA: Insufficient documentation

## 2022-10-04 DIAGNOSIS — I10 Essential (primary) hypertension: Secondary | ICD-10-CM | POA: Insufficient documentation

## 2022-10-04 DIAGNOSIS — R0789 Other chest pain: Secondary | ICD-10-CM

## 2022-10-04 DIAGNOSIS — S299XXA Unspecified injury of thorax, initial encounter: Secondary | ICD-10-CM | POA: Diagnosis present

## 2022-10-04 DIAGNOSIS — Z79899 Other long term (current) drug therapy: Secondary | ICD-10-CM | POA: Insufficient documentation

## 2022-10-04 DIAGNOSIS — D72829 Elevated white blood cell count, unspecified: Secondary | ICD-10-CM | POA: Insufficient documentation

## 2022-10-04 DIAGNOSIS — W503XXA Accidental bite by another person, initial encounter: Secondary | ICD-10-CM

## 2022-10-04 DIAGNOSIS — F419 Anxiety disorder, unspecified: Secondary | ICD-10-CM | POA: Diagnosis not present

## 2022-10-04 DIAGNOSIS — E119 Type 2 diabetes mellitus without complications: Secondary | ICD-10-CM | POA: Insufficient documentation

## 2022-10-04 DIAGNOSIS — Z8616 Personal history of COVID-19: Secondary | ICD-10-CM | POA: Insufficient documentation

## 2022-10-04 DIAGNOSIS — E876 Hypokalemia: Secondary | ICD-10-CM | POA: Diagnosis not present

## 2022-10-04 DIAGNOSIS — S21151A Open bite of right front wall of thorax without penetration into thoracic cavity, initial encounter: Secondary | ICD-10-CM | POA: Insufficient documentation

## 2022-10-04 DIAGNOSIS — R079 Chest pain, unspecified: Secondary | ICD-10-CM

## 2022-10-04 DIAGNOSIS — Z7984 Long term (current) use of oral hypoglycemic drugs: Secondary | ICD-10-CM | POA: Insufficient documentation

## 2022-10-04 DIAGNOSIS — Z794 Long term (current) use of insulin: Secondary | ICD-10-CM | POA: Insufficient documentation

## 2022-10-04 LAB — BASIC METABOLIC PANEL
Anion gap: 11 (ref 5–15)
BUN: 15 mg/dL (ref 6–20)
CO2: 29 mmol/L (ref 22–32)
Calcium: 9.5 mg/dL (ref 8.9–10.3)
Chloride: 100 mmol/L (ref 98–111)
Creatinine, Ser: 1.72 mg/dL — ABNORMAL HIGH (ref 0.61–1.24)
GFR, Estimated: 48 mL/min — ABNORMAL LOW (ref >60)
Glucose, Bld: 122 mg/dL — ABNORMAL HIGH (ref 70–99)
Potassium: 3.2 mmol/L — ABNORMAL LOW (ref 3.5–5.1)
Sodium: 140 mmol/L (ref 135–145)

## 2022-10-04 LAB — CBC
HCT: 43 % (ref 39.0–52.0)
Hemoglobin: 14.8 g/dL (ref 13.0–17.0)
MCH: 29.2 pg (ref 26.0–34.0)
MCHC: 34.4 g/dL (ref 30.0–36.0)
MCV: 85 fL (ref 80.0–100.0)
Platelets: 336 10*3/uL (ref 150–400)
RBC: 5.06 MIL/uL (ref 4.22–5.81)
RDW: 12.3 % (ref 11.5–15.5)
WBC: 13.4 10*3/uL — ABNORMAL HIGH (ref 4.0–10.5)
nRBC: 0 % (ref 0.0–0.2)

## 2022-10-04 LAB — TROPONIN I (HIGH SENSITIVITY)
Troponin I (High Sensitivity): 30 ng/L — ABNORMAL HIGH (ref <18)
Troponin I (High Sensitivity): 27 ng/L — ABNORMAL HIGH (ref <18)

## 2022-10-04 LAB — RESP PANEL BY RT-PCR (RSV, FLU A&B, COVID)  RVPGX2
Influenza A by PCR: NEGATIVE
Influenza B by PCR: NEGATIVE
Resp Syncytial Virus by PCR: NEGATIVE
SARS Coronavirus 2 by RT PCR: NEGATIVE

## 2022-10-04 MED ORDER — LORAZEPAM 1 MG PO TABS
0.5000 mg | ORAL_TABLET | Freq: Once | ORAL | Status: AC
  Administered 2022-10-04: 0.5 mg via ORAL
  Filled 2022-10-04: qty 1

## 2022-10-04 MED ORDER — AMOXICILLIN-POT CLAVULANATE 875-125 MG PO TABS: 1.0000 | ORAL_TABLET | Freq: Two times a day (BID) | ORAL | 0 refills | Status: AC

## 2022-10-04 MED ORDER — HYDROXYZINE HCL 25 MG PO TABS: 25.0000 mg | ORAL_TABLET | Freq: Four times a day (QID) | ORAL | 0 refills | Status: AC

## 2022-10-04 NOTE — Telephone Encounter (Signed)
Note created to place outpatient cardiology referral for heart score greater than 3 and chest pain
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
## 2022-10-04 NOTE — ED Notes (Signed)
EKG captured; not transferring but physical copy is in triage

## 2022-10-04 NOTE — ED Triage Notes (Signed)
Patient reports left chest pain this evening , non radiating with mild SOB and nausea , denies emesis or diaphoresis .

## 2022-10-04 NOTE — ED Notes (Signed)
AVS reviewed with pt prior to discharge. Pt verbalizes understanding. Belongings with pt upon depart. Ambulatory to POV by self.
